# Patient Record
Sex: Female | Born: 2000 | Race: White | Hispanic: No | Marital: Single | Smoking: Never smoker
Health system: Southern US, Community
[De-identification: ages and names within clinical notes are randomized; demographics above are authoritative.]

## PROBLEM LIST (undated history)

## (undated) DIAGNOSIS — F419 Anxiety disorder, unspecified: Secondary | ICD-10-CM

## (undated) DIAGNOSIS — K5900 Constipation, unspecified: Secondary | ICD-10-CM

## (undated) DIAGNOSIS — K219 Gastro-esophageal reflux disease without esophagitis: Secondary | ICD-10-CM

## (undated) DIAGNOSIS — M797 Fibromyalgia: Secondary | ICD-10-CM

## (undated) DIAGNOSIS — F909 Attention-deficit hyperactivity disorder, unspecified type: Secondary | ICD-10-CM

## (undated) DIAGNOSIS — F32A Depression, unspecified: Secondary | ICD-10-CM

## (undated) DIAGNOSIS — F329 Major depressive disorder, single episode, unspecified: Secondary | ICD-10-CM

## (undated) HISTORY — DX: Constipation, unspecified: K59.00

## (undated) HISTORY — DX: Fibromyalgia: M79.7

---
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
## 2020-01-11 ENCOUNTER — Ambulatory Visit: Payer: Self-pay | Attending: Internal Medicine

## 2020-01-11 DIAGNOSIS — Z23 Encounter for immunization: Secondary | ICD-10-CM

## 2020-01-11 NOTE — Progress Notes (Signed)
   Covid-19 Vaccination Clinic  Name:  Crystal Clayton    MRN: [id number] DOB: December 25, 2000  01/11/2020  [name] was observed post Covid-19 immunization for 15 minutes without incident. She was provided with Vaccine Information Sheet and instruction to access the V-Safe system.   [name] was instructed to call 911 with any severe reactions post vaccine: Marland Kitchen Difficulty breathing  . Swelling of face and throat  . A fast heartbeat  . A bad rash all over body  . Dizziness and weakness   Immunizations Administered    Name Date Dose VIS Date Route   Pfizer COVID-19 Vaccine 01/11/2020  6:00 PM 0.3 mL 10/04/2019 Intramuscular   Manufacturer: [employer], [employer]   Lot: [id number]   NDC: [address]-4

## 2020-02-01 ENCOUNTER — Other Ambulatory Visit: Payer: Self-pay

## 2020-02-01 ENCOUNTER — Ambulatory Visit: Payer: Self-pay | Attending: Internal Medicine

## 2020-02-01 DIAGNOSIS — Z23 Encounter for immunization: Secondary | ICD-10-CM

## 2020-02-01 NOTE — Progress Notes (Signed)
   Covid-19 Vaccination Clinic  Name:  Avanti Jetter    MRN: [id number] DOB: 2001/03/06  02/01/2020  Ms. Lapre was observed post Covid-19 immunization for 15 minutes without incident. She was provided with Vaccine Information Sheet and instruction to access the V-Safe system.   Ms. Sassone was instructed to call 911 with any severe reactions post vaccine: Marland Kitchen Difficulty breathing  . Swelling of face and throat  . A fast heartbeat  . A bad rash all over body  . Dizziness and weakness   Immunizations Administered    Name Date Dose VIS Date Route   Pfizer COVID-19 Vaccine 02/01/2020  5:44 PM 0.3 mL 10/04/2019 Intramuscular   Manufacturer: [employer], [employer]   Lot: [id number]   NDC: [address]-4

## 2020-02-19 ENCOUNTER — Other Ambulatory Visit: Payer: Self-pay

## 2020-02-19 ENCOUNTER — Emergency Department: Payer: 59

## 2020-02-19 ENCOUNTER — Emergency Department
Admission: EM | Admit: 2020-02-19 | Discharge: 2020-02-19 | Disposition: A | Discharge status: Home / Self Care | Payer: 59 | Attending: Emergency Medicine | Admitting: Emergency Medicine

## 2020-02-19 DIAGNOSIS — R42 Dizziness and giddiness: Secondary | ICD-10-CM | POA: Diagnosis not present

## 2020-02-19 DIAGNOSIS — R109 Unspecified abdominal pain: Secondary | ICD-10-CM | POA: Diagnosis present

## 2020-02-19 DIAGNOSIS — R112 Nausea with vomiting, unspecified: Secondary | ICD-10-CM | POA: Insufficient documentation

## 2020-02-19 DIAGNOSIS — R1011 Right upper quadrant pain: Secondary | ICD-10-CM | POA: Diagnosis not present

## 2020-02-19 HISTORY — DX: Anxiety disorder, unspecified: F41.9

## 2020-02-19 HISTORY — DX: Attention-deficit hyperactivity disorder, unspecified type: F90.9

## 2020-02-19 HISTORY — DX: Depression, unspecified: F32.A

## 2020-02-19 LAB — CBC
HCT: 42.7 % (ref 36.0–46.0)
Hemoglobin: 14.8 g/dL (ref 12.0–15.0)
MCH: 29.4 pg (ref 26.0–34.0)
MCHC: 34.7 g/dL (ref 30.0–36.0)
MCV: 84.7 fL (ref 80.0–100.0)
Platelets: 264 10*3/uL (ref 150–400)
RBC: 5.04 MIL/uL (ref 3.87–5.11)
RDW: 11.9 % (ref 11.5–15.5)
WBC: 11.9 10*3/uL — ABNORMAL HIGH (ref 4.0–10.5)
nRBC: 0 % (ref 0.0–0.2)

## 2020-02-19 LAB — COMPREHENSIVE METABOLIC PANEL
ALT: 129 U/L — ABNORMAL HIGH (ref 0–44)
AST: 94 U/L — ABNORMAL HIGH (ref 15–41)
Albumin: 4.4 g/dL (ref 3.5–5.0)
Alkaline Phosphatase: 55 U/L (ref 38–126)
Anion gap: 10 (ref 5–15)
BUN: 14 mg/dL (ref 6–20)
CO2: 22 mmol/L (ref 22–32)
Calcium: 9.2 mg/dL (ref 8.9–10.3)
Chloride: 106 mmol/L (ref 98–111)
Creatinine, Ser: 1.04 mg/dL — ABNORMAL HIGH (ref 0.44–1.00)
GFR calc Af Amer: >60 mL/min (ref >60)
GFR calc non Af Amer: >60 mL/min (ref >60)
Glucose, Bld: 114 mg/dL — ABNORMAL HIGH (ref 70–99)
Potassium: 4 mmol/L (ref 3.5–5.1)
Sodium: 138 mmol/L (ref 135–145)
Total Bilirubin: 0.8 mg/dL (ref 0.3–1.2)
Total Protein: 7.7 g/dL (ref 6.5–8.1)

## 2020-02-19 LAB — URINALYSIS, COMPLETE (UACMP) WITH MICROSCOPIC
Bacteria, UA: NONE SEEN
Bilirubin Urine: NEGATIVE
Glucose, UA: NEGATIVE mg/dL
Hgb urine dipstick: NEGATIVE
Ketones, ur: 5 mg/dL — AB
Leukocytes,Ua: NEGATIVE
Nitrite: NEGATIVE
Protein, ur: NEGATIVE mg/dL
Specific Gravity, Urine: 1.019 (ref 1.005–1.030)
pH: 6 (ref 5.0–8.0)

## 2020-02-19 LAB — POCT PREGNANCY, URINE: Preg Test, Ur: NEGATIVE

## 2020-02-19 LAB — LIPASE, BLOOD: Lipase: 28 U/L (ref 11–51)

## 2020-02-19 MED ORDER — KETOROLAC TROMETHAMINE 30 MG/ML IJ SOLN
15.0000 mg | Freq: Once | INTRAMUSCULAR | Status: AC
  Administered 2020-02-19: 15 mg via INTRAVENOUS
  Filled 2020-02-19: qty 1

## 2020-02-19 MED ORDER — ONDANSETRON 4 MG PO TBDP
4.0000 mg | ORAL_TABLET | Freq: Three times a day (TID) | ORAL | 0 refills | Status: DC | PRN
Start: 2020-02-19 — End: 2022-08-22

## 2020-02-19 MED ORDER — SODIUM CHLORIDE 0.9% FLUSH: 3.0000 mL | Freq: Once | INTRAVENOUS | Status: DC

## 2020-02-19 MED ORDER — IOHEXOL 300 MG/ML  SOLN
100.0000 mL | Freq: Once | INTRAMUSCULAR | Status: AC | PRN
  Administered 2020-02-19: 17:00:00 100 mL via INTRAVENOUS
  Filled 2020-02-19: qty 100

## 2020-02-19 MED ORDER — SODIUM CHLORIDE 0.9 % IV BOLUS
1000.0000 mL | Freq: Once | INTRAVENOUS | Status: AC
  Administered 2020-02-19: 1000 mL via INTRAVENOUS

## 2020-02-19 MED ORDER — ONDANSETRON HCL 4 MG/2ML IJ SOLN
INTRAMUSCULAR | Status: AC
  Administered 2020-02-19: 15:00:00 4 mg via INTRAVENOUS
  Filled 2020-02-19: qty 2

## 2020-02-19 MED ORDER — PANTOPRAZOLE SODIUM 40 MG IV SOLR
40.0000 mg | Freq: Once | INTRAVENOUS | Status: AC
  Administered 2020-02-19: 15:00:00 40 mg via INTRAVENOUS
  Filled 2020-02-19: qty 40

## 2020-02-19 MED ORDER — ONDANSETRON HCL 4 MG/2ML IJ SOLN
4.0000 mg | Freq: Once | INTRAMUSCULAR | Status: AC
  Administered 2020-02-19: 4 mg via INTRAVENOUS
  Filled 2020-02-19: qty 2

## 2020-02-19 MED ORDER — FENTANYL CITRATE (PF) 100 MCG/2ML IJ SOLN
50.0000 ug | Freq: Once | INTRAMUSCULAR | Status: AC
  Administered 2020-02-19: 15:00:00 50 ug via INTRAVENOUS
  Filled 2020-02-19: qty 2

## 2020-02-19 MED ORDER — PROMETHAZINE HCL 25 MG/ML IJ SOLN
12.5000 mg | Freq: Once | INTRAMUSCULAR | Status: AC
  Administered 2020-02-19: 19:00:00 12.5 mg via INTRAVENOUS
  Filled 2020-02-19: qty 1

## 2020-02-19 MED ORDER — ONDANSETRON HCL 4 MG/2ML IJ SOLN: 4.0000 mg | Freq: Once | INTRAMUSCULAR | Status: AC

## 2020-02-19 NOTE — ED Notes (Signed)
Pt ambulatory with steady gait to bathroom at this time.

## 2020-02-19 NOTE — ED Triage Notes (Signed)
Pt comes via POV from home with c/o abdominal pain and vomiting that started this am.  Pt states one of her roommates has been sick with stomach bug.  Pt states 10/10 pain in belly.

## 2020-02-19 NOTE — ED Notes (Signed)
Pt given crackers and water per Dr. Fuller Plan for PO challenge.

## 2020-02-19 NOTE — ED Notes (Signed)
Pt here c/o upper abdominal pain, NVD, and chest pain. Also reports feeling dizzy. Symptoms started at 9am this morning.

## 2020-02-19 NOTE — ED Notes (Signed)
Pt transported to US

## 2020-02-19 NOTE — ED Provider Notes (Signed)
[facility] Emergency Department Provider Note  ____________________________________________   First MD Initiated Contact with Patient 02/19/20 1438     (approximate)  I have reviewed the triage vital signs and the nursing notes.   HISTORY  Chief Complaint Abdominal Pain    HPI Crystal Clayton is a 19 y.o. female with history of anxiety who comes in for abdominal pain.  Patient stated that she woke up this morning with nausea and vomiting.  States it has been associate with some upper abdominal pain mostly that is constant, severe, nothing makes better, nothing makes it worse.  Does have some pain in the right lower abdomen as well but not as severe.  No urinary symptoms.  Denies having abdominal surgeries before.  States that her roommate has also been sick but does not think she ate in the same food.          Past Medical History:  Diagnosis Date  . ADHD   . Anxiety   . Depression     There are no problems to display for this patient.   History reviewed. No pertinent surgical history.  Prior to Admission medications   Not on File    Allergies Patient has no allergy information on record.  No family history on file.  Social History Social History   Tobacco Use  . Smoking status: Never Smoker  . Smokeless tobacco: Never Used  Substance Use Topics  . Alcohol use: Never  . Drug use: Never      Review of Systems Constitutional: No fever/chills Eyes: No visual changes. ENT: No sore throat. Cardiovascular: Denies chest pain. Respiratory: Denies shortness of breath. Gastrointestinal: Positive abdominal pain and vomiting no diarrhea.  No constipation. Genitourinary: Negative for dysuria. Musculoskeletal: Negative for back pain. Skin: Negative for rash. Neurological: Negative for headaches, focal weakness or numbness. All other ROS negative ____________________________________________   PHYSICAL EXAM:  VITAL  SIGNS: ED Triage Vitals  Enc Vitals Group     BP 02/19/20 1333 109/77     Pulse Rate 02/19/20 1333 (!) 107     Resp 02/19/20 1333 18     Temp 02/19/20 1333 97.8 F (36.6 C)     Temp src --      SpO2 02/19/20 1333 100 %     Weight 02/19/20 1335 230 lb (104.3 kg)     Height 02/19/20 1335 5\' 8"  (1.727 m)     Head Circumference --      Peak Flow --      Pain Score 02/19/20 1335 10     Pain Loc --      Pain Edu? --      Excl. in GC? --     Constitutional: Alert and oriented. Well appearing and in no acute distress. Eyes: Conjunctivae are normal. EOMI. Head: Atraumatic. Nose: No congestion/rhinnorhea. Mouth/Throat: Mucous membranes are moist.   Neck: No stridor. Trachea Midline. FROM Cardiovascular: Normal rate, regular rhythm. Grossly normal heart sounds.  Good peripheral circulation. Respiratory: Normal respiratory effort.  No retractions. Lungs CTAB. Gastrointestinal: Mostly tender epigastrically and a little bit right upper quadrant.  Maybe some tenderness in the right lower quadrant but not as significant..  No distention. No abdominal bruits.  No rebound.  No guarding. Musculoskeletal: No lower extremity tenderness nor edema.  No joint effusions. Neurologic:  Normal speech and language. No gross focal neurologic deficits are appreciated.  Skin:  Skin is warm, dry and intact. No rash noted. Psychiatric: Mood and affect are  normal. Speech and behavior are normal. GU: Deferred   ____________________________________________   LABS (all labs ordered are listed, but only abnormal results are displayed)  Labs Reviewed  COMPREHENSIVE METABOLIC PANEL - Abnormal; Notable for the following components:      Result Value   Glucose, Bld 114 (*)    Creatinine, Ser 1.04 (*)    AST 94 (*)    ALT 129 (*)    All other components within normal limits  CBC - Abnormal; Notable for the following components:   WBC 11.9 (*)    All other components within normal limits  URINALYSIS, COMPLETE  (UACMP) WITH MICROSCOPIC - Abnormal; Notable for the following components:   Color, Urine YELLOW (*)    APPearance HAZY (*)    Ketones, ur 5 (*)    All other components within normal limits  LIPASE, BLOOD  POC URINE PREG, ED  POCT PREGNANCY, URINE   ____________________________________________   ED ECG REPORT I, Concha Se, the attending physician, personally viewed and interpreted this ECG.  EKG is sinus rate of 88, no solution, T wave inversion in lead III with a PVC, normal intervals otherwise ____________________________________________  RADIOLOGY Vela Prose, personally viewed and evaluated these images (plain radiographs) as part of my medical decision making, as well as reviewing the written report by the radiologist.  ED MD interpretation: I personally reviewed ultrasound not show any signs of gallstones.  Official radiology report(s): US ABDOMEN LIMITED RUQ  Result Date: 02/19/2020 CLINICAL DATA:  RIGHT upper quadrant pain for 1 day, nausea, vomiting, diarrhea, and chest pain since 0900 hours this morning EXAM: ULTRASOUND ABDOMEN LIMITED RIGHT UPPER QUADRANT COMPARISON:  None FINDINGS: Gallbladder: Normally distended without stones or wall thickening. No pericholecystic fluid or sonographic Murphy sign. Common bile duct: Diameter: 4 mm, normal Liver: Echogenic parenchyma, likely fatty infiltration though this can be seen with cirrhosis and certain infiltrative disorders. Probable focal fatty infiltration of liver adjacent to falciform fissure. No definite hepatic mass lesion identified though assessment of intrahepatic detail is limited due to sound attenuation. Portal vein is patent on color Doppler imaging with normal direction of blood flow towards the liver. Other: No RIGHT upper quadrant free fluid. IMPRESSION: Probable fatty infiltration of liver as above with suspected focal sparing adjacent to gallbladder fossa. No definite acute upper abdominal sonographic  abnormalities identified. Electronically Signed   By: Ulyses Southward M.D.   On: 02/19/2020 16:50    ____________________________________________   PROCEDURES  Procedure(s) performed (including Critical Care):  Procedures   ____________________________________________   INITIAL IMPRESSION / ASSESSMENT AND PLAN / ED COURSE  Omni Dunsworth was evaluated in Emergency Department on 02/19/2020 for the symptoms described in the history of present illness. She was evaluated in the context of the global COVID-19 pandemic, which necessitated consideration that the patient might be at risk for infection with the SARS-CoV-2 virus that causes COVID-19. Institutional protocols and algorithms that pertain to the evaluation of patients at risk for COVID-19 are in a state of rapid change based on information released by regulatory bodies including the CDC and federal and state organizations. These policies and algorithms were followed during the patient's care in the ED.    Patient is a 19 year old who comes in with nausea, vomiting and upper abdominal pain.  Labs evaluate for cholecystitis, pancreatitis, AKI.  Discussed with patient option of CT versus ultrasound.  Patient's pain mostly is in the right upper quadrant.  Maybe slight tenderness in the right lower quadrant.  We will start off with a ultrasound evaluate for gallbladder pathology and if negative we can discuss CT if she continues to have significant pain.  Labs are notable with a slightly elevation in her white count 11.9 and slight elevation of her AST and ALT.  Kidney function also slightly elevated but unclear baseline.  Ultrasound gallbladder was negative.  Reevaluated patient and now she seems to be having more tenderness in the right lower quadrant.  We discussed CT imaging to make sure no evidence of appendicitis.   IMPRESSION: Negative. No CT evidence for acute intra-abdominal or pelvic abnormality. Hepatic steatosis.  Appendix was  normal.  Patient negative CT scan but she continues to have some nausea but has not actually had any vomiting.  Patient given dose of Phenergan.  Patient was able to tolerate p.o. and at this point feels comfortable going home.  Will send with some Zofran.  Suspect is most likely food poisoning given otherwise reassuring CT imaging.  Patient given copies of the reports given concern for fatty liver  I discussed the provisional nature of ED diagnosis, the treatment so far, the ongoing plan of care, follow up appointments and return precautions with the patient and any family or support people present. They expressed understanding and agreed with the plan, discharged home.            ____________________________________________   FINAL CLINICAL IMPRESSION(S) / ED DIAGNOSES   Final diagnoses:  RUQ pain  Intractable vomiting with nausea, unspecified vomiting type      MEDICATIONS GIVEN DURING THIS VISIT:  Medications  sodium chloride flush (NS) 0.9 % injection 3 mL ( Intravenous Canceled Entry 02/19/20 1443)  ondansetron (ZOFRAN) injection 4 mg (4 mg Intravenous Given 02/19/20 1440)  sodium chloride 0.9 % bolus 1,000 mL (1,000 mLs Intravenous New Bag/Given 02/19/20 1516)  fentaNYL (SUBLIMAZE) injection 50 mcg (50 mcg Intravenous Given 02/19/20 1518)  ondansetron (ZOFRAN) injection 4 mg (4 mg Intravenous Given 02/19/20 1518)  pantoprazole (PROTONIX) injection 40 mg (40 mg Intravenous Given 02/19/20 1517)  iohexol (OMNIPAQUE) 300 MG/ML solution 100 mL (100 mLs Intravenous Contrast Given 02/19/20 1717)  promethazine (PHENERGAN) injection 12.5 mg (12.5 mg Intravenous Given 02/19/20 1834)  ketorolac (TORADOL) 30 MG/ML injection 15 mg (15 mg Intravenous Given 02/19/20 1857)     ED Discharge Orders         Ordered    ondansetron (ZOFRAN ODT) 4 MG disintegrating tablet  Every 8 hours PRN     02/19/20 2017           Note:  This document was prepared using Dragon voice recognition  software and may include unintentional dictation errors.   Vanessa [place], MD 02/19/20 2018

## 2020-02-19 NOTE — Discharge Instructions (Addendum)
Your liver function was slightly elevated and you had the below findings of your liver.  You can follow this up with your primary care doctor.  We prescribe some nausea medicine called Zofran.  You can take Tylenol 1 g every hours.  Most likely this will pass the next 1 to 2 days.  Return to the ER if you develop worsening vomiting, fevers or any other concerns  IMPRESSION: Probable fatty infiltration of liver as above with suspected focal sparing adjacent to gallbladder fossa.   No definite acute upper abdominal sonographic abnormalities identified.  IMPRESSION: Negative. No CT evidence for acute intra-abdominal or pelvic abnormality. Hepatic steatosis.

## 2020-07-29 ENCOUNTER — Telehealth: Payer: 59 | Admitting: Family

## 2020-07-29 DIAGNOSIS — J069 Acute upper respiratory infection, unspecified: Secondary | ICD-10-CM

## 2020-07-29 MED ORDER — FLUTICASONE PROPIONATE 50 MCG/ACT NA SUSP
2.0000 | Freq: Every day | NASAL | 6 refills | Status: DC
Start: 2020-07-29 — End: 2022-08-22

## 2020-07-29 MED ORDER — BENZONATATE 100 MG PO CAPS
100.0000 mg | ORAL_CAPSULE | Freq: Three times a day (TID) | ORAL | 0 refills | Status: DC | PRN
Start: 2020-07-29 — End: 2022-08-22

## 2020-07-29 NOTE — Progress Notes (Signed)
We are sorry you are not feeling well.  Here is how we plan to help!  Based on what you have shared with me, it looks like you may have a viral upper respiratory infection.  Upper respiratory infections are caused by a large number of viruses; however, rhinovirus is the most common cause.   Given your symptoms, you also need to be tested for COVID to rule this out.    Symptoms vary from person to person, with common symptoms including sore throat, cough, fatigue or lack of energy and feeling of general discomfort.  A low-grade fever of up to 100.4 may present, but is often uncommon.  Symptoms vary however, and are closely related to a person's age or underlying illnesses.  The most common symptoms associated with an upper respiratory infection are nasal discharge or congestion, cough, sneezing, headache and pressure in the ears and face.  These symptoms usually persist for about 3 to 10 days, but can last up to 2 weeks.  It is important to know that upper respiratory infections do not cause serious illness or complications in most cases.    Upper respiratory infections can be transmitted from person to person, with the most common method of transmission being a person's hands.  The virus is able to live on the skin and can infect other persons for up to 2 hours after direct contact.  Also, these can be transmitted when someone coughs or sneezes; thus, it is important to cover the mouth to reduce this risk.  To keep the spread of the illness at bay, good hand hygiene is very important.  This is an infection that is most likely caused by a virus. There are no specific treatments other than to help you with the symptoms until the infection runs its course.  We are sorry you are not feeling well.  Here is how we plan to help!   For nasal congestion, you may use an oral decongestants such as Mucinex D or if you have glaucoma or high blood pressure use plain Mucinex.  Saline nasal spray or nasal drops can  help and can safely be used as often as needed for congestion.  For your congestion, I have prescribed Fluticasone nasal spray one spray in each nostril twice a day  If you do not have a history of heart disease, hypertension, diabetes or thyroid disease, prostate/bladder issues or glaucoma, you may also use Sudafed to treat nasal congestion.  It is highly recommended that you consult with a pharmacist or your primary care physician to ensure this medication is safe for you to take.     If you have a cough, you may use cough suppressants such as Delsym and Robitussin.  If you have glaucoma or high blood pressure, you can also use Coricidin HBP.   For cough I have prescribed for you A prescription cough medication called Tessalon Perles 100 mg. You may take 1-2 capsules every 8 hours as needed for cough  If you have a sore or scratchy throat, use a saltwater gargle-  to  teaspoon of salt dissolved in a 4-ounce to 8-ounce glass of warm water.  Gargle the solution for approximately 15-30 seconds and then spit.  It is important not to swallow the solution.  You can also use throat lozenges/cough drops and Chloraseptic spray to help with throat pain or discomfort.  Warm or cold liquids can also be helpful in relieving throat pain.  For headache, pain or general discomfort, you can  use Ibuprofen or Tylenol as directed.   Some authorities believe that zinc sprays or the use of Echinacea may shorten the course of your symptoms.   HOME CARE  Only take medications as instructed by your medical team.  Be sure to drink plenty of fluids. Water is fine as well as fruit juices, sodas and electrolyte beverages. You may want to stay away from caffeine or alcohol. If you are nauseated, try taking small sips of liquids. How do you know if you are getting enough fluid? Your urine should be a pale yellow or almost colorless.  Get rest.  Taking a steamy shower or using a humidifier may help nasal congestion and  ease sore throat pain. You can place a towel over your head and breathe in the steam from hot water coming from a faucet.  Using a saline nasal spray works much the same way.  Cough drops, hard candies and sore throat lozenges may ease your cough.  Avoid close contacts especially the very young and the elderly  Cover your mouth if you cough or sneeze  Always remember to wash your hands.   GET HELP RIGHT AWAY IF:  You develop worsening fever.  If your symptoms do not improve within 10 days  You develop yellow or green discharge from your nose over 3 days.  You have coughing fits  You develop a severe head ache or visual changes.  You develop shortness of breath, difficulty breathing or start having chest pain  Your symptoms persist after you have completed your treatment plan  MAKE SURE YOU   Understand these instructions.  Will watch your condition.  Will get help right away if you are not doing well or get worse.  Your e-visit answers were reviewed by a board certified advanced clinical practitioner to complete your personal care plan. Depending upon the condition, your plan could have included both over the counter or prescription medications. Please review your pharmacy choice. If there is a problem, you may call our nursing hot line at and have the prescription routed to another pharmacy. Your safety is important to [place]. If you have drug allergies check your prescription carefully.   You can use MyChart to ask questions about todays visit, request a non-urgent call back, or ask for a work or school excuse for 24 hours related to this e-Visit. If it has been greater than 24 hours you will need to follow up with your provider, or enter a new e-Visit to address those concerns. You will get an e-mail in the next two days asking about your experience.  I hope that your e-visit has been valuable and will speed your recovery. Thank you for using e-visits.  Approximately 5  minutes was spent documenting and reviewing patient's chart.

## 2021-04-28 IMAGING — US US ABDOMEN LIMITED
1 series · 14 of 25 positions shown · non-contrast
Comparison: None

CLINICAL DATA: RIGHT upper quadrant pain for 1 day, nausea,
vomiting, diarrhea, and chest pain since 9199 hours this morning

EXAM:
ULTRASOUND ABDOMEN LIMITED RIGHT UPPER QUADRANT

[Series 1: us abdomen limited · 0.21mm/px · 14 of 50 slices shown]
[im 1/50]
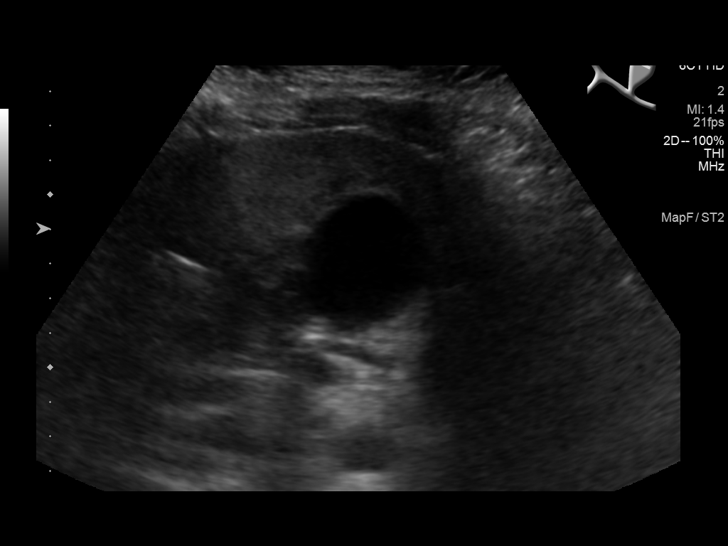
[im 5/50]
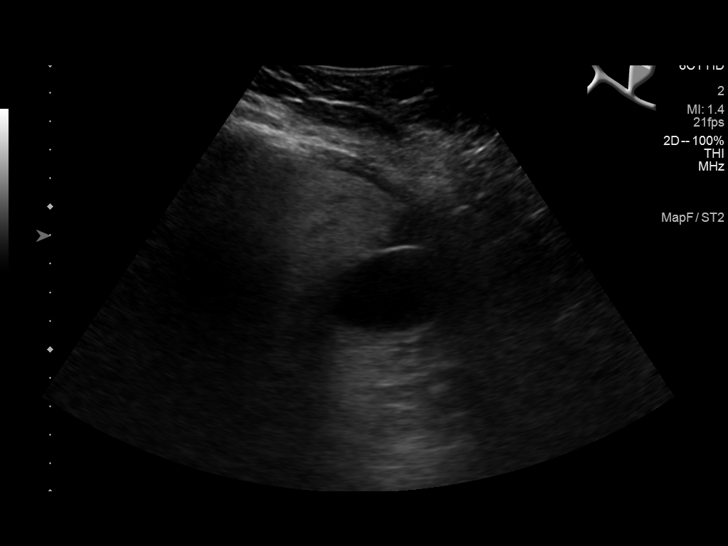
[im 9/50]
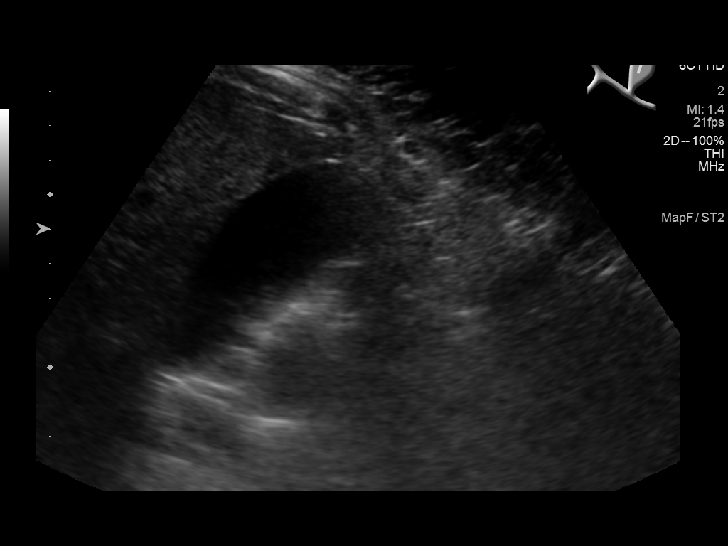
[im 13/50]
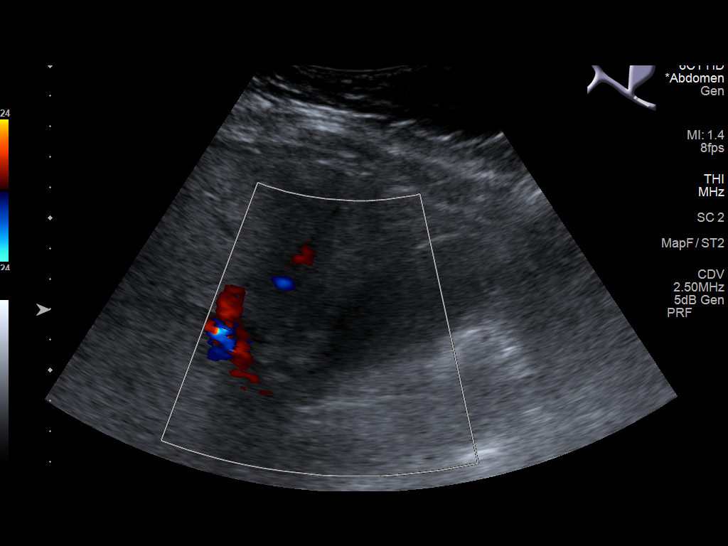
[im 17/50]
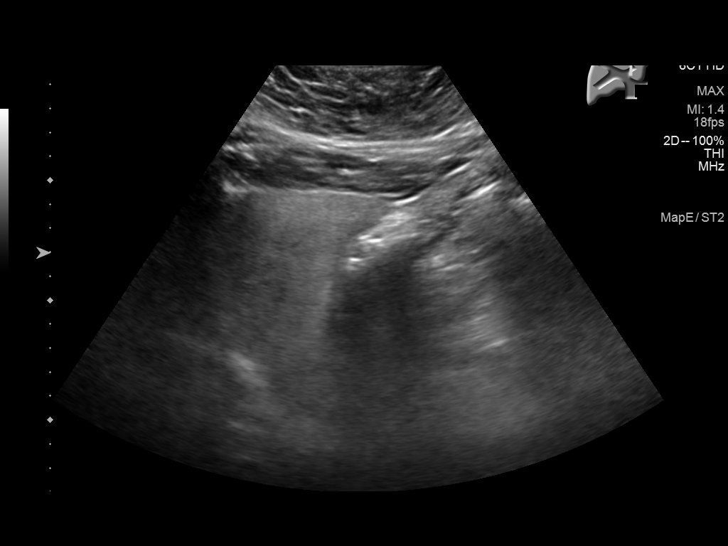
[im 19/50]
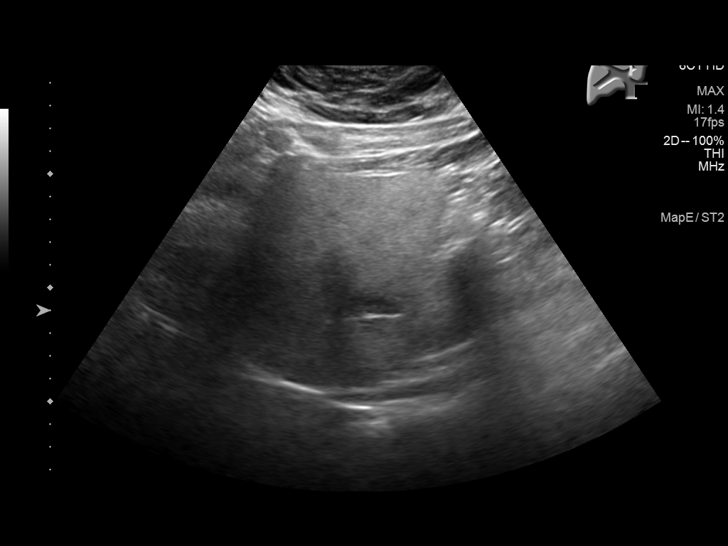
[im 23/50]
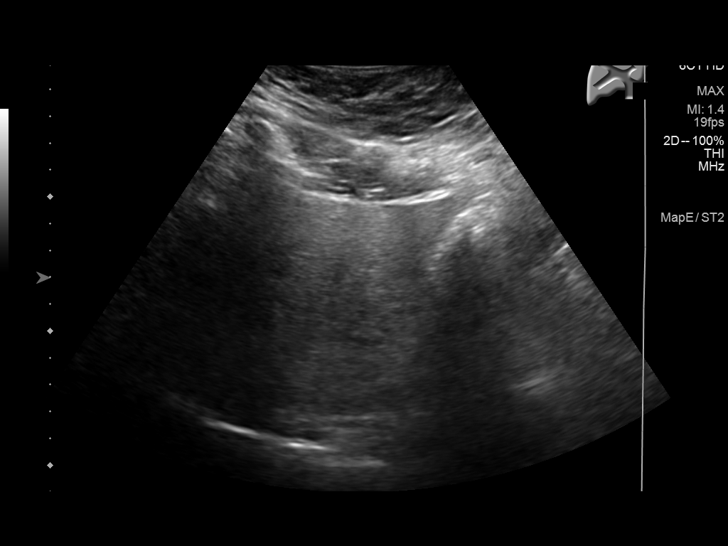
[im 27/50]
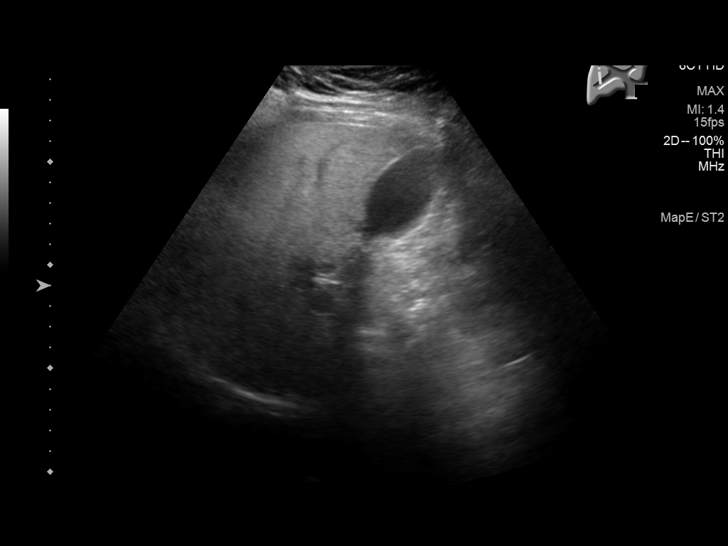
[im 31/50]
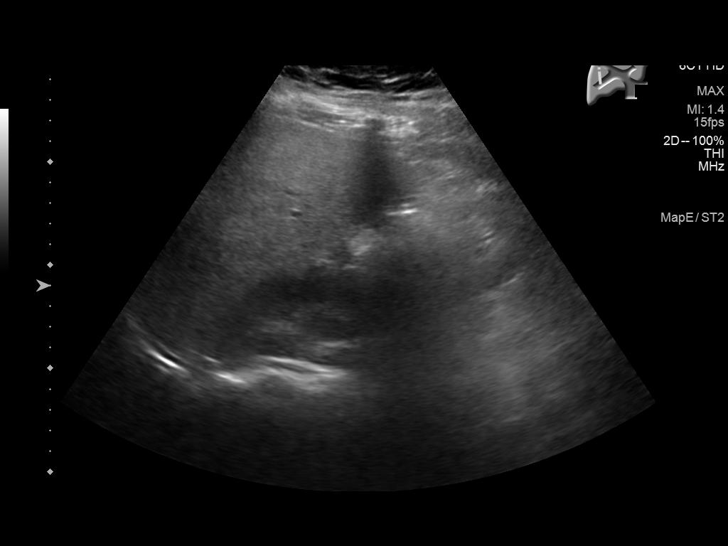
[im 33/50]
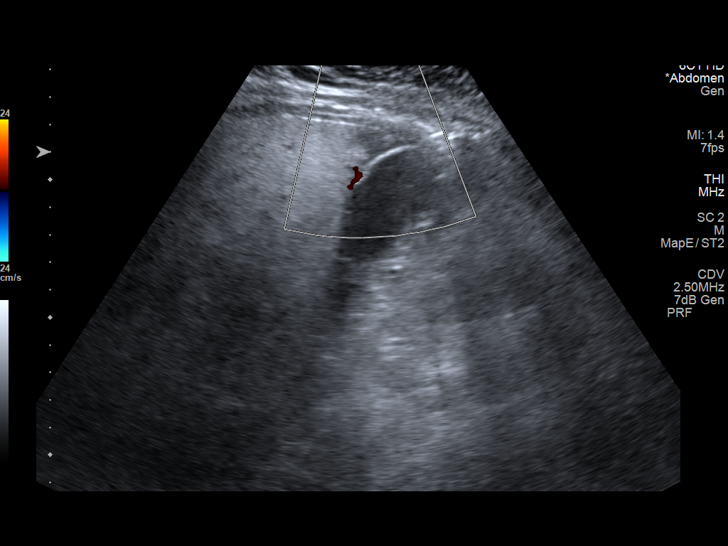
[im 37/50]
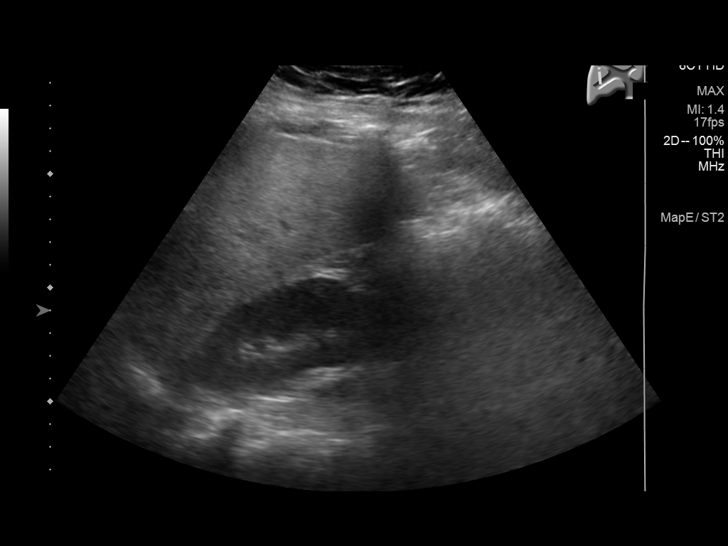
[im 41/50]
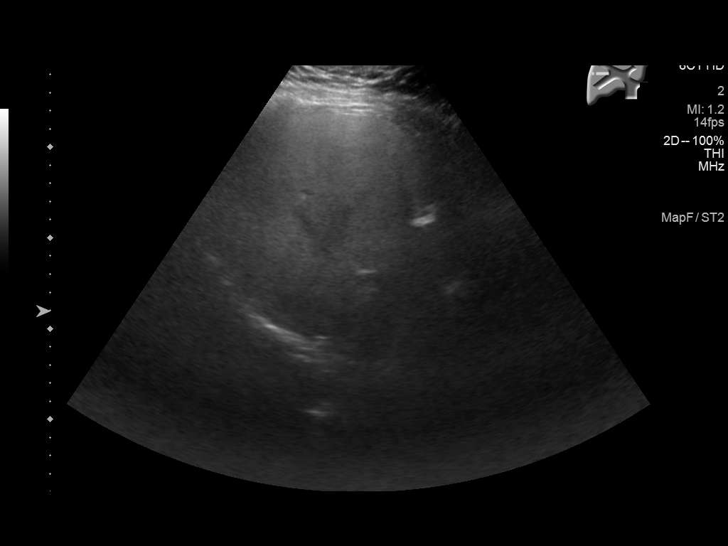
[im 45/50]
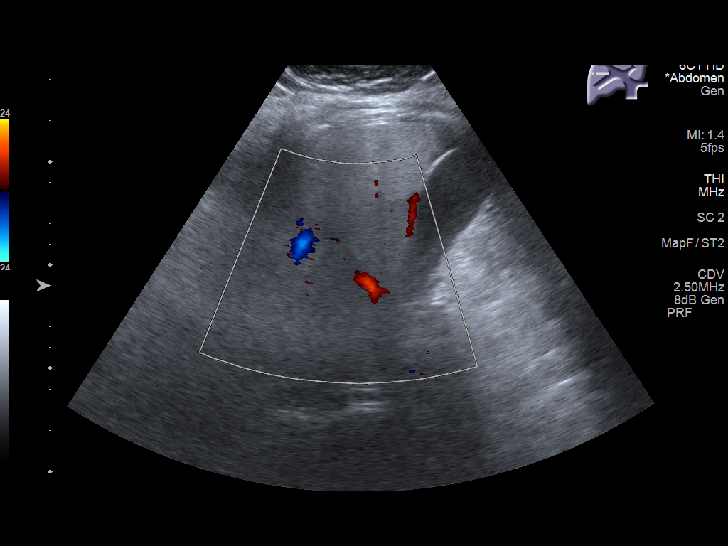
[im 50/50]
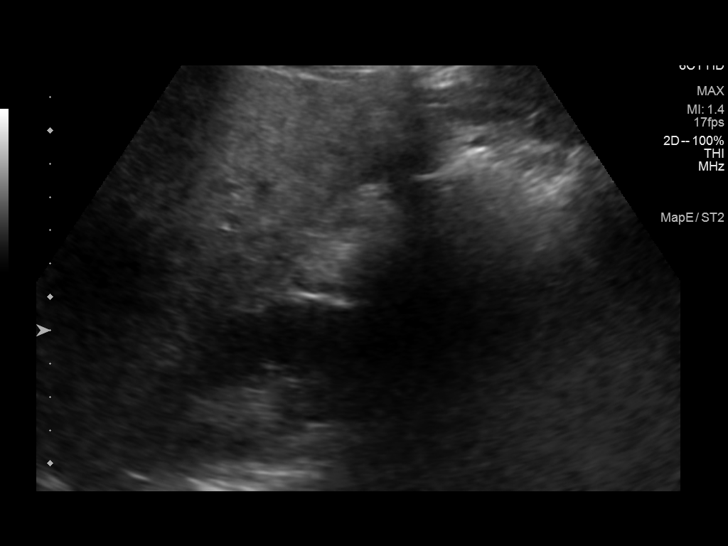

[14 of 25 positions shown; findings below may reference images not displayed]

FINDINGS: Gallbladder:

Normally distended without stones or wall thickening. No
pericholecystic fluid or sonographic Murphy sign.

Common bile duct:

Diameter: 4 mm, normal

Liver:

Echogenic parenchyma, likely fatty infiltration though this can be
seen with cirrhosis and certain infiltrative disorders. Probable
focal fatty infiltration of liver adjacent to falciform fissure. No
definite hepatic mass lesion identified though assessment of
intrahepatic detail is limited due to sound attenuation. Portal vein
is patent on color Doppler imaging with normal direction of blood
flow towards the liver.

Other: No RIGHT upper quadrant free fluid.
IMPRESSION: Probable fatty infiltration of liver as above with suspected focal
sparing adjacent to gallbladder fossa.

No definite acute upper abdominal sonographic abnormalities
identified.

## 2021-05-10 ENCOUNTER — Encounter (INDEPENDENT_AMBULATORY_CARE_PROVIDER_SITE_OTHER): Payer: Self-pay | Admitting: Internal Medicine

## 2021-05-10 ENCOUNTER — Telehealth (INDEPENDENT_AMBULATORY_CARE_PROVIDER_SITE_OTHER): Payer: 59 | Admitting: Internal Medicine

## 2021-05-10 DIAGNOSIS — M79641 Pain in right hand: Secondary | ICD-10-CM

## 2021-05-10 DIAGNOSIS — M79671 Pain in right foot: Secondary | ICD-10-CM

## 2021-05-10 DIAGNOSIS — M79642 Pain in left hand: Secondary | ICD-10-CM

## 2021-05-10 DIAGNOSIS — M25562 Pain in left knee: Secondary | ICD-10-CM

## 2021-05-10 DIAGNOSIS — M79672 Pain in left foot: Secondary | ICD-10-CM

## 2021-05-10 DIAGNOSIS — M25552 Pain in left hip: Secondary | ICD-10-CM

## 2021-05-10 DIAGNOSIS — M25551 Pain in right hip: Secondary | ICD-10-CM

## 2021-05-10 DIAGNOSIS — M797 Fibromyalgia: Secondary | ICD-10-CM

## 2021-05-10 DIAGNOSIS — G8929 Other chronic pain: Secondary | ICD-10-CM

## 2021-05-10 DIAGNOSIS — M25561 Pain in right knee: Secondary | ICD-10-CM

## 2021-05-10 NOTE — Patient Instructions (Signed)
Dear Brenda Jordan ,     Thanks for arranging a video visit with me.    Here are things I'd like you to do following today's visit:  Please have blood taken for labs.  Please have x-rays of multiple joints    You may retrieve any orders from this visit in Mychart by using these steps:    After signing into Mychart  1) click Messaging  2) click Letters  3) select the Requisition  4) Review and click print    Please contact the clinic at [phone] with any questions.  Your follow up visit has been scheduled.    Sincerely,    Debe Coder, MD  Rheumatologist  [facility]  [phone]

## 2021-05-10 NOTE — Progress Notes (Signed)
RHEUMATOLOGY NEW PATIENT TELEMEDICINE NOTE    Telemedicine Documentation Requirements  Originating site (Patient location): home  Distant site (Provider location): Office  Provider and Title: Dr. Adin Hector  Type of Visit: Video Visit  Verbal Consent has been obtained to conduct a telemedicine visit on this date in order to minimize exposure to COVID-19.      Chief Complaint   Patient presents with    Consult (Initial)     Fibromyalgia dx 2018 by Dr. Jerilynn Mages, current all over body pain       PCP: Powers, Desiree Lucy, MD    HPI:    Brenda Jordan is a 20 y.o. female with Past Medical History of ADHD who presents to the rheumatology clinic for new evaluation of reported fibromyalgia.    She was previously seeing Dr. Dory Peru and diagnosed with fibromyalgia in 2018.  She says she is looking for a new rheumatologist.  She was told that fibromyalgia involves tender points.  She was told to change her diet and exercise more.  She was also recommended to take vitamin D.  She has increased her exercise.  She has been working on Pharmacist, hospital.    She reports joint pain, her knees and ankles.  She says it gets worse with walking.  Her wrists bother her sometimes.      Her Right hip has been hurting.  SHe says the lateral hip hurts.      She says snkles and knees hurt more with walking.    She denies inflammatory arthralgias.      She is unable to say if her back feels better worsening with walking.    She denies paresthesias.    She reports trouble falling asleep-melatonin helps.  She does not wake up in the morning feeling well rested-She reports fatigue.    She reports intermittent stomach issues from high school.    No skin psoriasis.    She is in college-she is at [employer].  She is staying with her mom, she is studying Lobbyist.  She denies tobacco use.  She drinks alcohol once a semester.  She denies recreational drug use.    She denies psoriasis, RA IBD in the family.     The following sections were reviewed  this encounter by the provider:   Tobacco   Allergies   Meds   Problems   Med Hx   Surg Hx   Fam Hx            PMH/PSH:  Past Medical History:   Diagnosis Date    Anxiety     seeing counselor since 07/2016    Constipation     Gastroesophageal reflux disease     Prilosec        Past Surgical History:   Procedure Laterality Date    EGD N/A 12/08/2016    Procedure: EGD;  Surgeon: Venita Sheffield, MD;  Location: [facility];  Service: Gastroenterology;  Laterality: N/A;  EGD W/IVA    teeth removal  2017    2 baby teeth and a wisdom tooth        FH/SH:  Family History   Problem Relation Age of Onset    GER disease Mother        Social History     Tobacco Use    Smoking status: Never    Smokeless tobacco: Never   Vaping Use    Vaping Use: Never used   Substance Use Topics    Alcohol  use: No    Drug use: No        Meds/ Allergies:  Outpatient Medications Marked as Taking for the 05/10/21 encounter (Telemedicine Visit) with Danella Deis, MD   Medication Sig Dispense Refill    FLUoxetine (PROZAC) 10 MG capsule Take 40 mg by mouth daily  1    methylphenidate (RITALIN) 10 MG tablet TAKE ONE AND A HALF TABS BY MOUTH EVERY EVENING      methylphenidate (RITALIN) 20 MG tablet Take 20 mg by mouth every morning       Allergies   Allergen Reactions    Amoxicillin Hives          PHYSICAL EXAM  General- WNWD, alert and oriented, NAD  Eyes- EOMI, PERRL, no conjunctival injection, no scleral icterus  ENMT- face symmetric without lesions  Skin- no rash, no alopecia  Pulm: No conversational dyspnea, breathing comfortable on room air  Neuro - no notable neurological deficits, no facial asymmetry      LABS:  No results found for: WBC, HGB, HCT, MCV, PLT   No results found for: CREAT, BUN, NA, K, CL, CO2   Lab Results   Component Value Date    ALT 48 (H) 06/08/2017    AST 31 06/08/2017    ALKPHOS 64 06/08/2017    BILITOTAL 0.3 06/08/2017     No results found for: ESR   No results found for: CRP   No results found for: ANA   No results  found for: RHEUMFACTOR   No results found for: CCP, CCPANTIBODYI  No results found for: URICACID        ASSESSMENT/PLAN:      Brenda Jordan is a 20 y.o. female with Past Medical History of ADHD who presents to the rheumatology clinic for new evaluation of reported fibromyalgia.    Patient with mechanical focal knee, ankle, wrist pain.  Not endorsing classic inflammatory arthralgias but will obtain baseline XRays and lab work.  She will follow up to discuss results.    1. Bilateral hand pain  - XR Foot Left AP And Lateral; Future  - XR Foot Right AP And Lateral; Future  - XR Hand Right PA And Lateral; Future  - XR Hand Left PA And Lateral; Future  - XR Knee 1 Or 2 Views Right; Future  - XR Knee 1 Or 2 Views Left; Future  - XR Hip Right 2-3 vw without pelvis; Future  - XR Hip Left 2-3 vw without pelvis; Future  - Comprehensive metabolic panel; Future  - CBC and differential; Future  - Sedimentation rate (ESR); Future  - C Reactive Protein; Future  - Rheumatoid factor; Future  - Cyclic Citrullinated Peptide Abs IgG/IgA; Future  - G-6-PD, RBC; Future  - Urinalysis; Future  - Protein / creatinine ratio, urine; Future  - Creatine Kinase (CK); Future  - TSH; Future  - PTH, Intact; Future  - Vitamin D,25 OH, Total; Future  - Uric acid; Future  - ANA, IFA; Future    2. Foot pain, bilateral  - XR Foot Left AP And Lateral; Future  - XR Foot Right AP And Lateral; Future  - XR Hand Right PA And Lateral; Future  - XR Hand Left PA And Lateral; Future  - XR Knee 1 Or 2 Views Right; Future  - XR Knee 1 Or 2 Views Left; Future  - XR Hip Right 2-3 vw without pelvis; Future  - XR Hip Left 2-3 vw without pelvis; Future  -  Comprehensive metabolic panel; Future  - CBC and differential; Future  - Sedimentation rate (ESR); Future  - C Reactive Protein; Future  - Rheumatoid factor; Future  - Cyclic Citrullinated Peptide Abs IgG/IgA; Future  - G-6-PD, RBC; Future  - Urinalysis; Future  - Protein / creatinine ratio, urine; Future  -  Creatine Kinase (CK); Future  - TSH; Future  - PTH, Intact; Future  - Vitamin D,25 OH, Total; Future  - Uric acid; Future  - ANA, IFA; Future    3. Chronic pain of both knees  - XR Foot Left AP And Lateral; Future  - XR Foot Right AP And Lateral; Future  - XR Hand Right PA And Lateral; Future  - XR Hand Left PA And Lateral; Future  - XR Knee 1 Or 2 Views Right; Future  - XR Knee 1 Or 2 Views Left; Future  - XR Hip Right 2-3 vw without pelvis; Future  - XR Hip Left 2-3 vw without pelvis; Future  - Comprehensive metabolic panel; Future  - CBC and differential; Future  - Sedimentation rate (ESR); Future  - C Reactive Protein; Future  - Rheumatoid factor; Future  - Cyclic Citrullinated Peptide Abs IgG/IgA; Future  - G-6-PD, RBC; Future  - Urinalysis; Future  - Protein / creatinine ratio, urine; Future  - Creatine Kinase (CK); Future  - TSH; Future  - PTH, Intact; Future  - Vitamin D,25 OH, Total; Future  - Uric acid; Future  - ANA, IFA; Future    4. Hip pain, bilateral  - XR Foot Left AP And Lateral; Future  - XR Foot Right AP And Lateral; Future  - XR Hand Right PA And Lateral; Future  - XR Hand Left PA And Lateral; Future  - XR Knee 1 Or 2 Views Right; Future  - XR Knee 1 Or 2 Views Left; Future  - XR Hip Right 2-3 vw without pelvis; Future  - XR Hip Left 2-3 vw without pelvis; Future  - Comprehensive metabolic panel; Future  - CBC and differential; Future  - Sedimentation rate (ESR); Future  - C Reactive Protein; Future  - Rheumatoid factor; Future  - Cyclic Citrullinated Peptide Abs IgG/IgA; Future  - G-6-PD, RBC; Future  - Urinalysis; Future  - Protein / creatinine ratio, urine; Future  - Creatine Kinase (CK); Future  - TSH; Future  - PTH, Intact; Future  - Vitamin D,25 OH, Total; Future  - Uric acid; Future  - ANA, IFA; Future    5. Fibromyalgia  - Comprehensive metabolic panel; Future  - CBC and differential; Future  - Sedimentation rate (ESR); Future  - C Reactive Protein; Future  - Rheumatoid factor; Future  -  Cyclic Citrullinated Peptide Abs IgG/IgA; Future  - G-6-PD, RBC; Future  - Urinalysis; Future  - Protein / creatinine ratio, urine; Future  - Creatine Kinase (CK); Future  - TSH; Future  - PTH, Intact; Future  - Vitamin D,25 OH, Total; Future  - Uric acid; Future  - ANA, IFA; Future       Total of 25 minutes spent reviewing patient records, interviewing patient and coordinating plan of care.     FOLLOW UP:  Return in about 30 days (around 06/09/2021).          Debe Coder, MD   Rheumatologist  [address]  [address]   [place], [place] [address]  P [phone]  F [phone]

## 2021-05-14 ENCOUNTER — Other Ambulatory Visit (INDEPENDENT_AMBULATORY_CARE_PROVIDER_SITE_OTHER): Payer: Self-pay | Admitting: Internal Medicine

## 2021-05-15 LAB — URINALYSIS
Bilirubin, UA: NEGATIVE
Blood, UA: NEGATIVE
Glucose, UA: NEGATIVE
Ketones UA: NEGATIVE
Leukocyte Esterase, UA: NEGATIVE
Nitrite, UA: NEGATIVE
Protein, UR: NEGATIVE
Specific Gravity UA: 1.016 (ref 1.005–1.030)
Urine pH: 7.5 (ref 5.0–7.5)
Urobilinogen, UA: 0.2 mg/dL (ref 0.2–1.0)

## 2021-05-15 LAB — RHEUMATOID FACTOR: RA Latex Turbid.: <10 IU/mL (ref <14.0)

## 2021-05-15 LAB — CBC AND DIFFERENTIAL
Baso(Absolute): 0 10*3/uL (ref 0.0–0.2)
Basophils Automated: 0 %
Eosinophils Absolute: 0.1 10*3/uL (ref 0.0–0.4)
Eosinophils Automated: 1 %
Hematocrit: 41.4 % (ref 34.0–46.6)
Hemoglobin: 13.5 g/dL (ref 11.1–15.9)
Immature Granulocytes Absolute: 0 10*3/uL (ref 0.0–0.1)
Immature Granulocytes: 0 %
Lymphocytes Absolute: 2.8 10*3/uL (ref 0.7–3.1)
Lymphocytes Automated: 40 %
MCH: 28.6 pg (ref 26.6–33.0)
MCHC: 32.6 g/dL (ref 31.5–35.7)
MCV: 88 fL (ref 79–97)
Monocytes Absolute: 0.5 10*3/uL (ref 0.1–0.9)
Monocytes: 7 %
Neutrophils Absolute Count: 3.6 10*3/uL (ref 1.4–7.0)
Neutrophils: 52 %
Platelets: 293 10*3/uL (ref 150–450)
RBC: 4.72 x10E6/uL (ref 3.77–5.28)
RDW: 13 % (ref 11.7–15.4)
WBC: 7 10*3/uL (ref 3.4–10.8)

## 2021-05-15 LAB — COMPREHENSIVE METABOLIC PANEL
ALT: 183 IU/L — ABNORMAL HIGH (ref 0–32)
AST (SGOT): 104 IU/L — ABNORMAL HIGH (ref 0–40)
Albumin/Globulin Ratio: 2 (ref 1.2–2.2)
Albumin: 4.6 g/dL (ref 3.9–5.0)
Alkaline Phosphatase: 72 IU/L (ref 42–106)
BUN / Creatinine Ratio: 11 (ref 9–23)
BUN: 12 mg/dL (ref 6–20)
Bilirubin, Total: 0.3 mg/dL (ref 0.0–1.2)
CO2: 24 mmol/L (ref 20–29)
Calcium: 9.5 mg/dL (ref 8.7–10.2)
Chloride: 103 mmol/L (ref 96–106)
Creatinine: 1.05 mg/dL — ABNORMAL HIGH (ref 0.57–1.00)
Globulin, Total: 2.3 g/dL (ref 1.5–4.5)
Glucose: 102 mg/dL — ABNORMAL HIGH (ref 65–99)
Potassium: 4.2 mmol/L (ref 3.5–5.2)
Protein, Total: 6.9 g/dL (ref 6.0–8.5)
Sodium: 143 mmol/L (ref 134–144)
eGFR: 78 mL/min/{1.73_m2} (ref >59)

## 2021-05-15 LAB — PTH, INTACT: PARATHYROID HORMONE INTACT: 26 pg/mL (ref 15–65)

## 2021-05-15 LAB — TSH: TSH: 1.2 u[IU]/mL (ref 0.450–4.500)

## 2021-05-15 LAB — VITAMIN D,25 OH,TOTAL: Vitamin D 25-Hydroxy: 31.5 ng/mL (ref 30.0–100.0)

## 2021-05-15 LAB — CK: Creatinine Kinase, Total: 164 U/L (ref 32–182)

## 2021-05-15 LAB — C-REACTIVE PROTEIN: C-Reactive Protein: 4 mg/L (ref 0–10)

## 2021-05-15 LAB — URIC ACID: Uric acid: 6.7 mg/dL — ABNORMAL HIGH (ref 2.6–6.2)

## 2021-05-15 LAB — SEDIMENTATION RATE: Sed Rate: 7 mm/hr (ref 0–32)

## 2021-05-16 LAB — PROTEIN / CREATININE RATIO, URINE
Creatinine, UR: 105.9 mg/dL
Protein/Creatinine Ratio: 172 mg/g creat (ref 0–200)
Urine Protein Random: 18.2 mg/dL

## 2021-05-16 LAB — G-6-PD, RBC: G-6-PD, Quant: 248 U/10E12 RBC (ref 155–399)

## 2021-05-17 ENCOUNTER — Encounter (INDEPENDENT_AMBULATORY_CARE_PROVIDER_SITE_OTHER): Payer: Self-pay | Admitting: Internal Medicine

## 2021-05-18 LAB — CCP ANTIBODIES IGG/IGA: CCP Antibodies IgG/IgA: 4 units (ref 0–19)

## 2021-05-23 LAB — ANA, IFA: Antinuclear Antibodies (ANA): NEGATIVE

## 2021-06-03 ENCOUNTER — Other Ambulatory Visit: Payer: Self-pay | Admitting: Women's Health

## 2021-06-07 ENCOUNTER — Encounter (INDEPENDENT_AMBULATORY_CARE_PROVIDER_SITE_OTHER): Payer: Self-pay | Admitting: Internal Medicine

## 2021-06-07 ENCOUNTER — Telehealth (INDEPENDENT_AMBULATORY_CARE_PROVIDER_SITE_OTHER): Payer: 59 | Admitting: Internal Medicine

## 2021-06-07 DIAGNOSIS — M8480 Other disorders of continuity of bone, unspecified site: Secondary | ICD-10-CM

## 2021-06-07 NOTE — Patient Instructions (Addendum)
Dear Brenda Jordan ,     Thanks for arranging a video visit with me.    Please follow up with your primary care provider and psychiatrist for fibromyalgia related pain and non restorative sleep.    Please follow up with your primary provider regarding elevated Liver Enzymes    Here are things I'd like you to do following today's visit:  Please schedule your referral to orthopedic surgery    You may retrieve any orders from this visit in Mychart by using these steps:    After signing into Mychart  1) click Messaging  2) click Letters  3) select the Requisition  4) Review and click print    Please contact the clinic at [phone] with any questions.    Sincerely,    Debe Coder, MD  Rheumatologist  [facility]  [address]  [place], [place] [address]  Phone: [phone]  Fax: [phone]      Fibromyalgia Syndrome (FMS or fibromyalgia) is a chronic pain syndrome with symptoms of musculoskeletal pain resulting from central nervous system (CNS) sensitization to pain and other stimuli. Widespread body pain is the most common symptom. Other symptoms include fatigue, poor sleep, headaches, and irritable bowels. Current evidence suggests Fibromyalgia is a CNS disorder with alteration in pain processing that may last a long time because of involvement of learning and memory parts of the brain. Fibromyalgia symptoms are not due to damage or inflammation of tissues    Comments from the [employer] of Rheumatology (ACR)   -Fibromyalgia affects two - four percent of people, women more often than men.  -Fibromyalgia is not an autoimmune or inflammation based illness, but research suggests the nervous system is involved.  -Doctors diagnose fibromyalgia based on all the patient's relevant symptoms (what you feel), no longer just on the number of tender places during an examination.  -There is no test to detect this disease, but you may need lab tests or X-rays to rule out other health  problems.  -Though there is no cure, patients may take charge of their symptoms with targeted  self-care including: low impact exercise, weight loss, mindfulness practices, and getting enough sleep. Recent studies suggest tai-chi may be more helpful than aerobic exercise in Fibromyalgia.  - Medications sometimes can help with symptoms in some patients but do not eliminate pain completely. Also, effectiveness may diminish over time and side effects may occur.    The role of the rheumatologist according to the ACR:  Fibromyalgia is not a form of arthritis (joint disease). It does not cause inflammation or damage to joints, muscles or other tissues. However, because fibromyalgia can cause chronic pain and fatigue similar to arthritis, some people may advise you to see a rheumatologist. As a result, often a rheumatologist detects this disease (and rules out rheumatic diseases). For long term care, you do not need to follow with a rheumatologist. Your primary care physician can provide all the other care and treatment of fibromyalgia that you need.    For more information on Fibromyalgia visit:  [email or web address]    For resources on patient-centered techniques to help with symptoms:  [email or web address].es    For information on Tai Chi visit:  [email or web address]  Or  [email or web address]

## 2021-06-07 NOTE — Progress Notes (Signed)
RHEUMATOLOGY FOLLOW UP TELEMEDICINE NOTE    Telemedicine Documentation Requirements  Originating site (Patient location): home  Distant site (Provider location): Office  Type of Visit: Video Visit  Provider and Title: Dr. Adin Hector  Verbal Consent has been obtained to conduct a telemedicine visit on this date in order to minimize exposure to COVID-19.      PCP: Desmond Dike, MD    HPI:    Brenda Jordan is a 20 y.o. female with PMH ADHD who presents for follow up telemedicine evaluation of fibromyalgia    Patient Denies any changes in health from last visit.  She is here to discuss results.    Labs work reveals Uric Acid 6.7  Elevated LFTs    XRay Films personally Reviewed:  05/14/21  R knee: no joint OA, no chondrocalcinosis, distal femur greater than 2 cm density  L knee: no joint space narrowing, no chondrocalcinosis  Bil hands: No marginal erosions, periostitis or joint space narrowing  Bil feet: No marginal erosions, periostitis or joint space narrowing  Bil hips: no joint space narrowing, no soft tissue calcifications         The following sections were reviewed this encounter by the provider:   Tobacco  Allergies  Meds  Problems  Med Hx  Surg Hx  Fam Hx         PMH/PSH:  Past Medical History:   Diagnosis Date    Anxiety     seeing counselor since 07/2016    Constipation     Gastroesophageal reflux disease     Prilosec        Past Surgical History:   Procedure Laterality Date    EGD N/A 12/08/2016    Procedure: EGD;  Surgeon: Venita Sheffield, MD;  Location: [facility];  Service: Gastroenterology;  Laterality: N/A;  EGD W/IVA    teeth removal  2017    2 baby teeth and a wisdom tooth        FH/SH:  Family History   Problem Relation Age of Onset    GER disease Mother        Social History     Tobacco Use    Smoking status: Never    Smokeless tobacco: Never   Vaping Use    Vaping Use: Never used   Substance Use Topics    Alcohol use: No    Drug use: No        Meds/ Allergies:  No outpatient  medications have been marked as taking for the 06/07/21 encounter (Telemedicine Visit) with Danella Deis, MD.     Allergies   Allergen Reactions    Amoxicillin Hives          PHYSICAL EXAM  General- WNWD, alert and oriented, NAD  Eyes- EOMI, PERRL, no conjunctival injection, no scleral icterus  ENMT- face symmetric without lesions  Skin- no rash, no alopecia  Pulm: No conversational dyspnea, breathing comfortable on room air  Neuro - no notable neurological deficits, no facial asymmetry        LABS:  Lab Results   Component Value Date    WBC 7.0 05/14/2021    HGB 13.5 05/14/2021    HCT 41.4 05/14/2021    MCV 88 05/14/2021    PLT 293 05/14/2021      Lab Results   Component Value Date    CREAT 1.05 (H) 05/14/2021    BUN 12 05/14/2021    NA 143 05/14/2021    K 4.2 05/14/2021  CL 103 05/14/2021    CO2 24 05/14/2021      Lab Results   Component Value Date    ALT 183 (H) 05/14/2021    AST 104 (H) 05/14/2021    ALKPHOS 72 05/14/2021    BILITOTAL 0.3 05/14/2021     Lab Results   Component Value Date    ESR 7 05/14/2021      Lab Results   Component Value Date    CRP 4 05/14/2021      Lab Results   Component Value Date    ANA Negative 05/14/2021      No results found for: RHEUMFACTOR   No results found for: CCP, CCPANTIBODYI  Lab Results   Component Value Date    URICACID 6.7 (H) 05/14/2021           ASSESSMENT/PLAN:    Brenda Jordan is a 20 y.o. female with PMH ADHD who presents for follow up telemedicine evaluation of fibromyalgia    XRays unrevealing for inflammatory arthritis or OA.  Serologies negative.    Recommend follow up with primary provider for elevated LFTs.    Recommend follow up with Psychiatrist and Primary Care Provider for non restorative sleep, underlying anxiety and fibromyalgia.    Overall symptoms are consistent with Fibromyalgia.  Fibromyalgia is a condition of central sensitization, not inflammation or autoimmunity.    Central sensitization causes a central nervous system response to  heightened stress that amplifies sensation markedly. This produces an augmentation of responsiveness of central neurons to input from unimodal and polymodal receptors, with altered sensory processing in the brain, and malfunctioning of antinociceptive mechanisms. Both top-down and bottom-up mechanisms augment pain and sensitivity to a variety of peripheral stimuli, including physical exertion.    Modest improvement of fibromyalgia has been observed with drugs targeting a diverse range of molecular mechanisms. None of these medications are specific to Rheumatology. No single drug has offered substantial efficacy. The heterogeneity of this disorder and widely varied responses to medications suggests existence of patient subgroups. As a result, therapies for fibromyalgia need to recognize the impact of central and peripheral aspects of the pathophysiology utilizing both medications and nonpharmacologic approaches.      1. Fibroma of bone  - Orthopedics Referral     Total Time spent reviewing records, interviewing patient and coordinating plan of care: 19 min    FOLLOW UP:  Return With your psychiatrist and primary care provider.                                                                                  Debe Coder, MD  Rheumatologist  [facility]  [address]  [place], [place] [address]  Phone: [phone]  Fax: [phone]

## 2022-08-22 ENCOUNTER — Encounter: Payer: Self-pay | Admitting: Medical

## 2022-08-22 ENCOUNTER — Other Ambulatory Visit: Payer: Self-pay

## 2022-08-22 ENCOUNTER — Ambulatory Visit (INDEPENDENT_AMBULATORY_CARE_PROVIDER_SITE_OTHER): Payer: 59 | Admitting: Medical

## 2022-08-22 VITALS — BP 119/79 | HR 105 | Temp 99.8°F | Ht 66.54 in | Wt 258.0 lb

## 2022-08-22 DIAGNOSIS — J40 Bronchitis, not specified as acute or chronic: Secondary | ICD-10-CM | POA: Diagnosis not present

## 2022-08-22 MED ORDER — AZITHROMYCIN 250 MG PO TABS: ORAL_TABLET | ORAL | 0 refills | Status: AC

## 2022-08-22 MED ORDER — ALBUTEROL SULFATE HFA 108 (90 BASE) MCG/ACT IN AERS: 1.0000 | INHALATION_SPRAY | RESPIRATORY_TRACT | 0 refills | Status: DC | PRN

## 2022-08-22 NOTE — Progress Notes (Unsigned)
[facility] [address] [place], [place] [address] Phone: [phone] Fax: [phone]   Office Visit Note  Patient Name: Crystal Clayton  Date of Birth:2001-09-06  Med Rec number [id number]  Date of Service: 08/22/2022  Amoxicillin  Chief Complaint  Patient presents with   Cough     HPI Sx began 8d ago with sore throat and ear pain (R>L). Sore throat has resolved about 5 days ago. Ear pain also resolved. Developed cough 4d ago along with hoarse voice. Cough has gotten worse. Cough sometimes produces some phlegm, clear or neon green. Has not noticed much nasal congestion, no significant nasal d/c. Cough affecting sleep last couple days. No fever or chills. Hard to take deep breath without inducing cough. Little SOB when coughing a lot. Has been using generic "severe cold and flu medicine", has not helped cough. Has not taken any meds today. No HA, has some myalgias but also has fibromyalgia.  No hx of asthma. Has hx of walking pneumonia twice after having flu. Denies any known exposures.   Current Medication:  Outpatient Encounter Medications as of 08/22/2022  Medication Sig   FLUoxetine (PROZAC) 10 MG capsule Take by mouth.   methylphenidate (RITALIN) 20 MG tablet Take by mouth.   [DISCONTINUED] benzonatate (TESSALON PERLES) 100 MG capsule Take 1 capsule (100 mg total) by mouth 3 (three) times daily as needed. (Patient not taking: Reported on 08/22/2022)   [DISCONTINUED] fluticasone (FLONASE) 50 MCG/ACT nasal spray Place 2 sprays into both nostrils daily. (Patient not taking: Reported on 08/22/2022)   [DISCONTINUED] ondansetron (ZOFRAN ODT) 4 MG disintegrating tablet Take 1 tablet (4 mg total) by mouth every 8 (eight) hours as needed for nausea or vomiting. (Patient not taking: Reported on 08/22/2022)   No facility-administered encounter medications on file as of 08/22/2022.      Medical History: Past Medical History:  Diagnosis Date   ADHD    Anxiety     Depression    Fibromyalgia      Vital Signs: BP 119/79   Pulse (!) 105   Temp 99.8 F (37.7 C) (Tympanic)   Ht 5' 6.54" (1.69 m)   Wt 258 lb (117 kg)   SpO2 97%   BMI 40.98 kg/m    Review of Systems  Physical Exam Vitals reviewed.  Constitutional:      General: She is not in acute distress.    Appearance: She is not ill-appearing.  HENT:     Head: Normocephalic.     Right Ear: Tympanic membrane, ear canal and external ear normal.     Left Ear: Tympanic membrane, ear canal and external ear normal.     Nose: No mucosal edema, congestion or rhinorrhea.     Mouth/Throat:     Mouth: Mucous membranes are moist. No oral lesions.     Pharynx: No pharyngeal swelling or posterior oropharyngeal erythema.     Tonsils: No tonsillar exudate.  Cardiovascular:     Rate and Rhythm: Regular rhythm. Tachycardia present.     Heart sounds: No murmur heard.    No friction rub. No gallop.  Pulmonary:     Effort: Pulmonary effort is normal.     Breath sounds: Normal breath sounds. No wheezing, rhonchi or rales.     Comments: Deep breath induces cough. Cough is aggressive and dry-sounding, though patient occasionally expectorating some mucus/saliva. Musculoskeletal:     Cervical back: Neck supple. No rigidity.  Lymphadenopathy:     Cervical: Cervical adenopathy (small anterior nodes) present.  Neurological:     Mental Status: She is alert.       Assessment/Plan: 1. Bronchitis Discussed that most tonsillitis viral. Recommended trial of Albuterol inhaler and Mucinex DM. Nighttime cough med at bedtime. May start Zpack if sx worsen or not improving in next few days. - albuterol (VENTOLIN HFA) 108 (90 Base) MCG/ACT inhaler; Inhale 1-2 puffs into the lungs every 4 (four) hours as needed for wheezing or shortness of breath.  Dispense: 8 g; Refill: 0 - azithromycin (ZITHROMAX) 250 MG tablet; Take 2 tablets on day 1, then 1 tablet daily on days 2 through 5  Dispense: 6 tablet; Refill: 0      General Counseling: Crystal Clayton verbalizes understanding of the findings of todays visit and agrees with plan of treatment. I have discussed any further diagnostic evaluation that may be needed or ordered today. We also reviewed her medications today. she has been encouraged to call the office with any questions or concerns that should arise related to todays visit.   No orders of the defined types were placed in this encounter.   No orders of the defined types were placed in this encounter.   Time spent:*** Minutes    Billey Gosling PA-C [place] 08/22/2022 1:34 PM

## 2022-08-24 NOTE — Patient Instructions (Signed)
-  Rest and stay well hydrated (by drinking water and other liquids). Avoid/limit caffeine. -Use albuterol inhaler every 4-6 hours as needed for cough or shortness of breath. -Try Mucinex DM during day and nighttime cough med (like Nyquil) at bedtime. -For your sore throat/cough, use cough drops/throat lozenges, gargle warm salt water and/or drink warm liquids (like tea with honey). -If your symptoms worsen (i.e. increased shortness of breath, fever) or don't improve despite using the medicines recommended above over the next 4-5 days, you may start the antibiotic (Azithromycin).  -Send MyChart message to provider or schedule return visit as needed for new/worsening symptoms or if symptoms do not improve as discussed with recommended treatment.

## 2023-01-30 ENCOUNTER — Ambulatory Visit (INDEPENDENT_AMBULATORY_CARE_PROVIDER_SITE_OTHER): Payer: 59 | Admitting: Medical

## 2023-01-30 ENCOUNTER — Encounter: Payer: Self-pay | Admitting: Medical

## 2023-01-30 ENCOUNTER — Other Ambulatory Visit: Payer: Self-pay

## 2023-01-30 VITALS — BP 108/61 | HR 71 | Temp 98.5°F | Wt 258.0 lb

## 2023-01-30 DIAGNOSIS — M25561 Pain in right knee: Secondary | ICD-10-CM | POA: Diagnosis not present

## 2023-01-30 NOTE — Progress Notes (Signed)
[facility] 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   Noted pain to right leg beginning 5 days ago. Initially noted pain while at dinner, had legs crossed right over left when it began. Had walked around normally that day, no usual activity recalled. No injury recalled. [place] to [place] for [place], no unusual activities there.  Rested and iced 1st night, took Tylenol, did not seem to help.  Pain has continued since then. Pain is pretty consistent. Worst when walking. Sometimes able to get into a position that does not cause pain. Initial pain was lateral/anterior right knee, radiates up into anterolateral right thigh and sometimes into lower right leg diagonally down to medial right ankle. No unusual back pain. No numbness, tingling or weakness. No discoloration or swelling. No calf pain. Current pain 6-7/10.   Has been able to sleep but had more difficulty last night. Tends to have less pain in AM when she wakes up.   Patient states she mis-stepped yesterday AM, thinks knee turned inward while foot was planted. Pain worse after that (9/10), better after a nap. Knee does not feel like it will give out or is unstable.  Has fibromyalgia, current pain feels different that what she experiences with her fibromyalgia.    Current Medication:  Outpatient Encounter Medications as of 01/30/2023  Medication Sig   FLUoxetine (PROZAC) 40 MG capsule Take 40 mg by mouth every morning.   hydrOXYzine (ATARAX) 25 MG tablet Take 25 mg by mouth 2 (two) times daily as needed.   methylphenidate (RITALIN LA) 20 MG 24 hr capsule Take 20 mg by mouth every morning.   Methylphenidate HCl  (RITALIN PO) Take 15 mg by mouth. Extended release as needed   [DISCONTINUED] albuterol (VENTOLIN HFA) 108 (90 Base) MCG/ACT inhaler Inhale 1-2 puffs into the lungs every 4 (four) hours as needed for wheezing or shortness of breath.   [DISCONTINUED] FLUoxetine (PROZAC) 10 MG capsule Take by mouth. (Patient not taking: Reported on 01/30/2023)   [DISCONTINUED] methylphenidate (RITALIN) 20 MG tablet Take by mouth.   No facility-administered encounter medications on file as of 01/30/2023.      Medical History: Past Medical History:  Diagnosis Date   ADHD    Anxiety    Depression    Fibromyalgia      Vital Signs: BP 108/61   Pulse 71   Temp 98.5 F (36.9 C) (Tympanic)   Wt 258 lb (117 kg)   SpO2 99%   BMI 40.98 kg/m    Review of Systems See HPI  Physical Exam Vitals reviewed.  Constitutional:      General: She is not in acute distress.    Appearance: She is not ill-appearing.     Comments: Mildly uncomfortable appearing seated on exam table, seems to be trying find comfortable position.  Musculoskeletal:     Right upper leg: No swelling, edema or deformity.     Right knee: No swelling, deformity, effusion or ecchymosis. Normal range of motion. Normal alignment and normal patellar mobility.     Right lower leg: No swelling, deformity or bony tenderness. No edema.     Comments: Moderately tender to lateral/anterior aspect  of right knee including lateral joint line. No swelling or discoloration to leg. Mildly tender to right anterior thigh and hip. Pain with extension at right knee, right hip flexion/extension and right ankle dorsiflexion. Sensation and strength grossly intact/symmetric to bilateral LE. Slow mildly antalgic gait, does bear weight on affected leg. Right calf nontender.   Neurological:     Mental Status: She is alert.     Assessment/Plan: 1. Acute pain of right knee Etiology of pain uncertain. Think fracture or significant intra-articular injury unlikely based  on history. Recommended trial of over-the-counter Aleve/Naproxen, 2 pills twice daily for next few days. Encouraged to rest/limit activity next few days. Elevate right knee when possible. May apply ice/cold compresses off and on. Gave info for [facility] urgent care - recommended visit if symptoms not improving over next 5-7 days or sooner if symptoms worsen or new symptoms arise (i.e. swelling, increased pain, decreased motion).   General Counseling: Rise PatienceHannah verbalizes understanding of the findings of todays visit and agrees with plan of treatment. she has been encouraged to call the office with any questions or concerns that should arise related to todays visit.   Time spent:25 Minutes    Jonathon ResidesKimberlee Noboru Bidinger PA-C [facility] 01/30/2023 10:32 AM

## 2023-02-06 NOTE — Patient Instructions (Addendum)
-  Take 2 over-the-counter  Aleve/Naproxen every 12 hours for the next few days. -Rest/limit activity next few days. Elevate right knee when possible.  -May apply ice/cold compresses off and on.  -Consider visiting [facility] urgent care if symptoms not improving over next 5-7 days or sooner if symptoms worsen or new symptoms arise (i.e. swelling, increased pain, decreased motion, numbness/tingling/weakness).

## 2023-04-10 ENCOUNTER — Other Ambulatory Visit: Payer: Self-pay | Admitting: Women's Health

## 2023-04-10 DIAGNOSIS — N911 Secondary amenorrhea: Secondary | ICD-10-CM

## 2023-04-12 ENCOUNTER — Ambulatory Visit
Admission: RE | Admit: 2023-04-12 | Discharge: 2023-04-12 | Disposition: A | Discharge status: Home / Self Care | Payer: 59 | Source: Ambulatory Visit | Attending: Women's Health | Admitting: Women's Health

## 2023-04-12 DIAGNOSIS — N911 Secondary amenorrhea: Secondary | ICD-10-CM | POA: Insufficient documentation

## 2023-06-15 ENCOUNTER — Emergency Department: Payer: 59

## 2023-06-15 ENCOUNTER — Emergency Department
Admission: EM | Admit: 2023-06-15 | Discharge: 2023-06-15 | Disposition: A | Discharge status: Home / Self Care | Payer: 59 | Attending: Emergency Medical Services | Admitting: Emergency Medical Services

## 2023-06-15 DIAGNOSIS — R519 Headache, unspecified: Secondary | ICD-10-CM | POA: Insufficient documentation

## 2023-06-15 DIAGNOSIS — N3000 Acute cystitis without hematuria: Secondary | ICD-10-CM | POA: Insufficient documentation

## 2023-06-15 LAB — LAB USE ONLY - CBC WITH DIFFERENTIAL
Absolute Basophils: 0.03 10*3/uL (ref 0.00–0.08)
Absolute Eosinophils: 0.03 10*3/uL (ref 0.00–0.44)
Absolute Immature Granulocytes: 0.01 10*3/uL (ref 0.00–0.07)
Absolute Lymphocytes: 2.28 10*3/uL (ref 0.42–3.22)
Absolute Monocytes: 0.35 10*3/uL (ref 0.21–0.85)
Absolute Neutrophils: 3.4 10*3/uL (ref 1.10–6.33)
Absolute nRBC: 0 10*3/uL (ref <=0.00)
Basophils %: 0.5 %
Eosinophils %: 0.5 %
Hematocrit: 40.4 % (ref 34.7–43.7)
Hemoglobin: 13.9 g/dL (ref 11.4–14.8)
Immature Granulocytes %: 0.2 %
Lymphocytes %: 37.4 %
MCH: 29.2 pg (ref 25.1–33.5)
MCHC: 34.4 g/dL (ref 31.5–35.8)
MCV: 84.9 fL (ref 78.0–96.0)
MPV: 8.6 fL — ABNORMAL LOW (ref 8.9–12.5)
Monocytes %: 5.7 %
Neutrophils %: 55.7 %
Platelet Count: 265 10*3/uL (ref 142–346)
Preliminary Absolute Neutrophil Count: 3.4 10*3/uL (ref 1.10–6.33)
RBC: 4.76 10*6/uL (ref 3.90–5.10)
RDW: 12 % (ref 11–15)
WBC: 6.1 10*3/uL (ref 3.10–9.50)
nRBC %: 0 /100 WBC (ref <=0.0)

## 2023-06-15 LAB — C-REACTIVE PROTEIN: C-Reactive Protein: 0.3 mg/dL (ref 0.0–1.1)

## 2023-06-15 LAB — BASIC METABOLIC PANEL
Anion Gap: 9 (ref 5.0–15.0)
BUN: 14 mg/dL (ref 7–21)
CO2: 23 mEq/L (ref 17–29)
Calcium: 9.5 mg/dL (ref 8.5–10.5)
Chloride: 109 mEq/L (ref 99–111)
Creatinine: 1 mg/dL (ref 0.4–1.0)
GFR: >60 mL/min/{1.73_m2} (ref >=60.0)
Glucose: 122 mg/dL — ABNORMAL HIGH (ref 70–100)
Potassium: 3.8 mEq/L (ref 3.5–5.3)
Sodium: 141 mEq/L (ref 135–145)

## 2023-06-15 LAB — URINALYSIS WITH REFLEX TO MICROSCOPIC EXAM - REFLEX TO CULTURE
Urine Bilirubin: NEGATIVE
Urine Glucose: NEGATIVE
Urine Ketones: NEGATIVE mg/dL
Urine Nitrite: NEGATIVE
Urine Protein: NEGATIVE
Urine Specific Gravity: 1.007 (ref 1.001–1.035)
Urine Urobilinogen: NORMAL mg/dL (ref 0.2–2.0)
Urine pH: 6.5 (ref 5.0–8.0)

## 2023-06-15 LAB — SERUM HCG, QUALITATIVE: hCG Qualitative: NEGATIVE

## 2023-06-15 LAB — SEDIMENTATION RATE: Sed Rate: 15 mm/Hr (ref <=20)

## 2023-06-15 LAB — LAB USE ONLY - URINE GRAY CULTURE HOLD TUBE

## 2023-06-15 MED ORDER — BUTALBITAL-APAP-CAFFEINE 50-325-40 MG PO TABS
1.0000 | ORAL_TABLET | ORAL | 0 refills | Status: AC | PRN
Start: 2023-06-15 — End: ?

## 2023-06-15 MED ORDER — SODIUM CHLORIDE 0.9 % IV BOLUS
1000.0000 mL | Freq: Once | INTRAVENOUS | Status: AC
Start: 2023-06-15 — End: 2023-06-15
  Administered 2023-06-15: 1000 mL via INTRAVENOUS

## 2023-06-15 MED ORDER — PROCHLORPERAZINE EDISYLATE 10 MG/2ML IJ SOLN
10.0000 mg | Freq: Once | INTRAMUSCULAR | Status: AC
Start: 2023-06-15 — End: 2023-06-15
  Administered 2023-06-15: 10 mg via INTRAVENOUS
  Filled 2023-06-15: qty 2

## 2023-06-15 MED ORDER — DIPHENHYDRAMINE HCL 50 MG/ML IJ SOLN
25.0000 mg | Freq: Once | INTRAMUSCULAR | Status: AC
Start: 2023-06-15 — End: 2023-06-15
  Administered 2023-06-15: 25 mg via INTRAVENOUS
  Filled 2023-06-15: qty 1

## 2023-06-15 MED ORDER — KETOROLAC TROMETHAMINE 30 MG/ML IJ SOLN
15.0000 mg | Freq: Once | INTRAMUSCULAR | Status: AC
Start: 2023-06-15 — End: 2023-06-15
  Administered 2023-06-15: 15 mg via INTRAVENOUS
  Filled 2023-06-15: qty 1

## 2023-06-15 MED ORDER — SULFAMETHOXAZOLE-TRIMETHOPRIM 800-160 MG PO TABS
1.0000 | ORAL_TABLET | Freq: Two times a day (BID) | ORAL | 0 refills | Status: AC
Start: 2023-06-15 — End: 2023-06-20

## 2023-06-15 NOTE — Discharge Instructions (Signed)
Dear Brenda Jordan,    You were seen today by Josph Macho, PA-C. Thank you for choosing the [facility] for your healthcare needs.  We hope your visit today was EXCELLENT.    FOLLOW UP WITH YOUR PRIMARY DOCTOR OR THE REFERRAL(S) PROVIDED TODAY FOR REPEAT EVALUATION AND TO DISCUSS ANY ABNORMAL TEST RESULTS. TAKE A COPY OF YOUR TEST RESULTS WITH YOU TO YOUR APPOINTMENT. RETURN TO THE ER IF YOU DEVELOP WORSENING SYMPTOMS, NEW CONCERNING SYMPTOMS, OR YOU ARE UNABLE TO SEE YOUR DOCTOR IN 2 DAYS.     Please take any medications prescribed as directed.     If you have any questions or concerns, I am available at [phone]. Please do not hesitate to contact me if I can be of assistance.     Below is some information and resources that our patients often find helpful.    Sincerely,    Josph Macho, Physician Assistant  [facility]    ________________________________________________________________  Thank you for choosing [facility] for your emergency care needs.  We strive to provide EXCELLENT care to you and your family.      If you do not continue to improve, your condition worsens, or you develop new concerning symptoms please contact your doctor or return immediately to the Emergency Department.    DOCTOR REFERRALS  Call [phone] (available 24 hours a day, 7 days a week) if you need any further referrals and we can help you find a primary care doctor or specialist.  Also, available online at:  [email or web address]    YOUR CONTACT INFORMATION  Before leaving please check with registration to make sure we have an up-to-date contact number.  You can call registration at [phone] to update your information.  For questions about your hospital bill, please call [phone].  For questions about your Emergency Dept Physician bill please call [phone].      FREE HEALTH SERVICES  If you need help  with health or social services, please call 2-1-1 for a free referral to resources in your area.  2-1-1 is a free service connecting people with information on health insurance, free clinics, pregnancy, mental health, dental care, food assistance, housing, and substance abuse counseling.  Also, available online at:  http://www.[facility].org    MEDICAL RECORDS AND TESTS  Certain laboratory test results do not come back the same day, for example urine cultures.  We will contact you if other important findings are noted. Radiology films are often reviewed again to ensure accuracy.  If there is any discrepancy, we will notify you.    Please call [phone] to pick up a complimentary CD of any radiology studies performed.  If you or your doctor would like to request a copy of your medical records, please call [phone].      ORTHOPEDIC INJURY   Please know that significant injuries can exist even when an initial x-ray is read as normal or negative.  This can occur because some fractures (broken bones) are not initially visible on x-rays or with soft tissue injuries.  For this reason, close outpatient follow-up with your primary care doctor or bone specialist (orthopedist) is required.    MEDICATIONS AND FOLLOWUP  Please be aware that some prescription medications can cause drowsiness.  Use caution when driving or operating machinery.    The examination and treatment you have received in our Emergency Department is provided on an emergency basis,  and is not intended to be a substitute for your primary care physician.  It is important that your doctor checks you again and that you report any new or remaining problems at that time.      24 HOUR PHARMACIES  [facility] - [address] [place], [place] [address] (1.4 miles, 7 minutes)  [facility] - [address], [place], [place] [address] (6.5 miles, 13 minutes)  Handout with directions available on request.

## 2023-06-15 NOTE — ED Provider Notes (Signed)
[facility]        History     Chief Complaint   Patient presents with    Headache     22 year old female with history of anxiety, ADHD, fibromyalgia, PCOS, presents with diffuse frontal headache that started Sunday night (4 days ago).  She notes it initially started as a "cluster headache "behind her right eye which is pretty normal for her.  She went to bed early that night and woke up very early Monday morning with a worsening headache that she describes as a "ax across my head ".  She states she took Aleve and fell back asleep.  She woke up Monday morning and felt "okay "and her headache was pretty much gone and then returned again at 10 AM upon leaving for work.  She notes throughout the following days her headache would improve and then worsen again, mostly worsening with exposures to light such as fluorescent lights or the sun.  She went to urgent care yesterday where she was diagnosed with a suspected migraine and treated with naproxen and another medication she cannot remember.  She notes she was taking naproxen without relief.  She was advised to come back for evaluation if she started feeling dizzy or weakness.  She notes she is feeling dizzy at times.  She still complains of headache to her frontal forehead bilaterally and pain behind her bilateral eyes.  She denies previous similar headaches in the past.  There has been no head trauma or injury.  No fevers chills, sore throat, cough, chest pain or shortness of breath.  No vision loss or changes in vision.  No eye pain.  She has had nausea but no vomiting.  She also does note some dysuria with urination.  No previous head CT imaging.    The history is provided by the patient.   Headache       Medical History[1]    Past Surgical History:   Procedure Laterality Date    EGD N/A 12/08/2016    Procedure: EGD;  Surgeon: Venita Sheffield, MD;  Location: [facility];  Service: Gastroenterology;  Laterality: N/A;  EGD W/IVA    teeth  removal  2017    2 baby teeth and a wisdom tooth       Family History   Problem Relation Age of Onset    GER disease Mother        Social  Social History[2]    .     Allergies[3]    Home Medications       Med List Status: In Progress Set By: Dimple Nanas, RN at 06/15/2023  7:15 AM              docusate sodium (COLACE) 100 MG capsule     Take 100 mg by mouth daily.3 Pills daily     FLUoxetine (PROZAC) 10 MG capsule     Take 4 capsules (40 mg) by mouth daily     ibuprofen (ADVIL,MOTRIN) 600 MG tablet     TAKE 1 TABLET BY MOUTH EVERY 6 TO 8 HOURS AS NEEDED FOR PAIN     loratadine (CLARITIN) 10 MG tablet     Take 10 mg by mouth daily.     methylphenidate (RITALIN) 10 MG tablet     TAKE ONE AND A HALF TABS BY MOUTH EVERY EVENING     methylphenidate (RITALIN) 20 MG tablet     Take 1 tablet (20 mg) by mouth every morning  norethindrone-ethinyl estradiol (MICROGESTIN 1/20) 1-20 MG-MCG per tablet     Take 1 tablet by mouth daily             Review of Systems   Neurological:  Positive for headaches.   All other systems reviewed and are negative.      Physical Exam    BP: 127/67, Heart Rate: 94, Temp: 98.6 F (37 C), Resp Rate: 20, SpO2: 97 %, Weight: 124.3 kg    Physical Exam  Vitals and nursing note reviewed.   Constitutional:       General: She is not in acute distress (Mild distress and appears photophobic with lights but otherwise very pleasant and answers all questions appropriately).     Appearance: Normal appearance. She is obese. She is not ill-appearing, toxic-appearing or diaphoretic.   HENT:      Head: Normocephalic and atraumatic.      Nose: Nose normal.   Eyes:      General: Lids are normal.      Extraocular Movements: Extraocular movements intact.      Conjunctiva/sclera: Conjunctivae normal.      Pupils: Pupils are equal, round, and reactive to light.   Cardiovascular:      Rate and Rhythm: Normal rate and regular rhythm.      Heart sounds: Normal heart sounds. No murmur heard.  Pulmonary:      Effort:  Pulmonary effort is normal. No respiratory distress.      Breath sounds: Normal breath sounds. No wheezing, rhonchi or rales.   Musculoskeletal:         General: Normal range of motion.      Cervical back: Normal range of motion and neck supple. No rigidity.   Skin:     General: Skin is warm and dry.   Neurological:      Mental Status: She is alert and oriented to person, place, and time.      GCS: GCS eye subscore is 4. GCS verbal subscore is 5. GCS motor subscore is 6.      Cranial Nerves: Cranial nerves 2-12 are intact. No cranial nerve deficit, dysarthria or facial asymmetry.      Sensory: Sensation is intact. No sensory deficit.      Motor: Motor function is intact. No weakness, tremor, abnormal muscle tone or pronator drift.      Coordination: Coordination is intact. Coordination normal. Finger-Nose-Finger Test normal. Rapid alternating movements normal.           MDM and ED Course     ED Medication Orders (From admission, onward)      Start Ordered     Status Ordering Provider    06/15/23 [id number] 06/15/23 0811  sodium chloride 0.9 % bolus 1,000 mL  Once        Route: Intravenous  Ordered Dose: 1,000 mL       Last MAR action: [employer], Makiyla Linch L    06/15/23 [id number] 06/15/23 0811  ketorolac (TORADOL) injection 15 mg  Once        Route: Intravenous  Ordered Dose: 15 mg       Last MAR action: Given Gildo Crisco L    06/15/23 0812 06/15/23 0811  prochlorperazine (COMPAZINE) injection 10 mg  Once        Route: Intravenous  Ordered Dose: 10 mg       Last MAR action: Given [employer] L    06/15/23 0812 06/15/23 0811  diphenhydrAMINE (BENADRYL) injection 25 mg  Once  Route: Intravenous  Ordered Dose: 25 mg       Last MAR action: Given Anaiah Mcmannis L               Medical Decision Making    DDX: Migraine headache, cluster headache, tension headache, ICH,     Laboratory results reviewed by ED provider with patient and/or family:  Yes  Radiologic study results reviewed by ED provider with patient  and/or family:  Yes      Re-eva; 10:10am-patient lying comfortably in bed, looks well overall.  Her UA with very small amount of pyuria, no squamous cells, given her recent dysuria would cover with oral antibiotics which she is in agreement with.  Will give Bactrim for 5 days and add on urine culture.  Her headache has improved.  Will continue with original plan of prescribed Fioricet.  Will give neurology for follow-up Discussed treatment and need for close outpatient follow up with their PMD and/or referrals provided today to discuss these findings. Strict return precautions given. All questions and concerns addressed.      Return Precautions     The patient  and/or patient's family is aware that this evaluation is only a screening for emergent conditions related to his or her symptoms and presentation.   The patient's workup today does not suggest emergent or life-threatening condition requiring admission or immediate specialist consultation.   All lab results and imaging, when applicable, were discussed at length with the patient and/or family.  I discussed the need for prompt follow-up with their primary care provider in less than 48 hours and, when applicable, specialists for which contact and referral information has been provided.  All questions and concern addressed prior to discharge. Patient and/or family demonstrates verbal understanding and are amenable to this plan.  Patient and/or family advised to return to the ED for any worsening symptoms, uncontrolled pain if applicable, worsening fevers, or any changes in their condition prompting concern and need for repeat evaluation and/or additional management in the next 12-24 hours.   Patient understands that they can return to the emergency department at any given time they are having difficulty with followup or other complaints.         This note was dictated using epic EMR system/dragon speech recognition and may contain inherent errors or omissions not  intended by the user.  Not all errors are caught or corrected even with proofreading attempts.  If there are questions or concerns about the content of this note or information contained within the body of this dictation they should be addressed directly with the author for clarification.      Clinical Impression:  The patient presents with headache.  Clinically well appearing and neurologically intact. Based on the history and exam, no evidence of hemorrhage, infection, increased ICP, CVA/TIA, or other serious causes are discernable at this time.  Given the low risk of these diagnoses further testing and evaluation does not appear to be indicated at this time. Pain improved, vitals stable. Headache precautions given. Warned to return immediately for worsening symptoms or any concerns.        Amount and/or Complexity of Data Reviewed  Labs: ordered.  Radiology: ordered.    Risk  Prescription drug management.          ED Course as of 06/15/23 1016   Thu Jun 15, 2023   1660 Patient's headache is improving.  She notes initially upon coming in she was rated 6 or 7 out of 10  and now is rated 2 out of 10.  She appears much more comfortable lying in bed.  We reviewed her blood work and imaging results which are reassuring.  UA results are pending.  Would plan to discharge home and discussed with patient she will need to follow-up with neurology for further outpatient headache workups will prescribe Fioricet as well. [JB]      ED Course User Index  [JB] Christene Slates, PA         NIH Stroke Score      Flowsheet Row Most Recent Value   NIH Stroke Scale    Interval Baseline admission to ED   1a. Level of Consciousness 0   1b. LOC Questions (age, month) 0   1c. LOC Commands (Open and close eyes, Grip AND release good hand) 0   2. Best Gaze 0   3. Visual Fields 0   4. Facial Palsy 0   5a. Motor Left Arm: (Arms with palm down X 10 seconds. Sitting = arms at 90 degrees. Supine = arms 45 degrees) 0   5b. Motor Right Arm: (Arms  with palm down X 10 seconds. Sitting = arms at 90 degrees Supine = arms 45 degrees) 0   Total Motor Arms 0   6a. Motor Left Leg: (Leg elevated X 5 seconds Supine = Leg 30 degrees) 0   6b. Motor Right Leg: (Leg elevated X 5 seconds Supine = Leg 30 degrees) 0   Total Motor Legs 0   7. Limb Ataxia (Finger to nose, heel to shin) 0   8. Sensory (Sensation or grimace to pin prick on face, arm, trunk, and leg) 0   9. Best language (Describe picture, name items, read sentences from NIHSS booklet) 0   10. Dysarthria (Read list from NIHSS booklet) 0   11. Extinction and Inattention (formerly Neglect) - Test tactile and visual stimulation 0   NIHSS Total 0            Procedures    Clinical Impression & Disposition     Clinical Impression  Final diagnoses:   Acute nonintractable headache, unspecified headache type   Acute cystitis without hematuria        ED Disposition       ED Disposition   Discharge    Condition   --    Date/Time   Thu Jun 15, 2023 10:14 AM    Comment   Patsey Berthold discharge to home/self care.    Condition at disposition: Stable                  New Prescriptions    BUTALBITAL-ACETAMINOPHEN-CAFFEINE (FIORICET) 50-325-40 MG PER TABLET    Take 1 tablet by mouth every 4 (four) hours as needed for Headaches    SULFAMETHOXAZOLE-TRIMETHOPRIM (BACTRIM DS) 800-160 MG PER TABLET    Take 1 tablet by mouth 2 (two) times daily for 5 days                   [1]   Past Medical History:  Diagnosis Date    Anxiety     seeing counselor since 07/2016    Constipation     Gastroesophageal reflux disease     Prilosec   [2]   Social History  Tobacco Use    Smoking status: Never    Smokeless tobacco: Never   Vaping Use    Vaping status: Never Used   Substance Use Topics    Alcohol use: No  Drug use: No   [3]   Allergies  Allergen Reactions    Amoxicillin Hives        Christene Slates, [place]  06/15/23 1016

## 2023-06-15 NOTE — ED Triage Notes (Signed)
reports headache that started sunday night, reports feeling dizzy, lightheaded and having sensitivity to light. denies history of migraines in the past. reports nausea as well. went to UC yesterday and prescribed naproxen which provided minimal relief.

## 2023-06-16 LAB — CULTURE, URINE: Culture Urine: NORMAL

## 2023-08-03 ENCOUNTER — Other Ambulatory Visit: Payer: Self-pay | Admitting: Student in an Organized Health Care Education/Training Program

## 2023-11-07 ENCOUNTER — Ambulatory Visit: Payer: 59 | Admitting: Neurology
